# Patient Record
Sex: Female | Born: 1950
Health system: Southern US, Community
[De-identification: ages and names within clinical notes are randomized; demographics above are authoritative.]

## PROBLEM LIST (undated history)

## (undated) DIAGNOSIS — T8859XA Other complications of anesthesia, initial encounter: Secondary | ICD-10-CM

## (undated) DIAGNOSIS — M8580 Other specified disorders of bone density and structure, unspecified site: Secondary | ICD-10-CM

## (undated) DIAGNOSIS — E211 Secondary hyperparathyroidism, not elsewhere classified: Secondary | ICD-10-CM

## (undated) DIAGNOSIS — R112 Nausea with vomiting, unspecified: Secondary | ICD-10-CM

## (undated) DIAGNOSIS — J343 Hypertrophy of nasal turbinates: Secondary | ICD-10-CM

## (undated) DIAGNOSIS — Z9889 Other specified postprocedural states: Secondary | ICD-10-CM

## (undated) DIAGNOSIS — H9192 Unspecified hearing loss, left ear: Secondary | ICD-10-CM

## (undated) DIAGNOSIS — M129 Arthropathy, unspecified: Secondary | ICD-10-CM

## (undated) DIAGNOSIS — E063 Autoimmune thyroiditis: Secondary | ICD-10-CM

## (undated) DIAGNOSIS — E890 Postprocedural hypothyroidism: Secondary | ICD-10-CM

## (undated) DIAGNOSIS — H8102 Meniere's disease, left ear: Secondary | ICD-10-CM

## (undated) DIAGNOSIS — G629 Polyneuropathy, unspecified: Secondary | ICD-10-CM

## (undated) DIAGNOSIS — T4145XA Adverse effect of unspecified anesthetic, initial encounter: Secondary | ICD-10-CM

## (undated) DIAGNOSIS — E559 Vitamin D deficiency, unspecified: Secondary | ICD-10-CM

## (undated) DIAGNOSIS — E0500 Thyrotoxicosis with diffuse goiter without thyrotoxic crisis or storm: Secondary | ICD-10-CM

## (undated) HISTORY — PX: ANTERIOR CRUCIATE LIGAMENT REPAIR: SHX115

## (undated) HISTORY — DX: Autoimmune thyroiditis: E06.3

## (undated) HISTORY — DX: Polyneuropathy, unspecified: G62.9

## (undated) HISTORY — DX: Meniere's disease, left ear: H81.02

## (undated) HISTORY — DX: Other specified disorders of bone density and structure, unspecified site: M85.80

## (undated) HISTORY — DX: Thyrotoxicosis with diffuse goiter without thyrotoxic crisis or storm: E05.00

## (undated) HISTORY — DX: Arthropathy, unspecified: M12.9

## (undated) HISTORY — DX: Secondary hyperparathyroidism, not elsewhere classified: E21.1

## (undated) HISTORY — DX: Postprocedural hypothyroidism: E89.0

## (undated) HISTORY — PX: TONSILLECTOMY AND ADENOIDECTOMY: SHX28

## (undated) HISTORY — DX: Vitamin D deficiency, unspecified: E55.9

## (undated) HISTORY — DX: Hypocalcemia: E83.51

## (undated) HISTORY — DX: Unspecified hearing loss, left ear: H91.92

## (undated) HISTORY — PX: THYROID SURGERY: SHX805

---

## 2000-07-25 ENCOUNTER — Other Ambulatory Visit: Admission: RE | Admit: 2000-07-25 | Discharge: 2000-07-25 | Payer: Self-pay | Admitting: Internal Medicine

## 2004-10-12 ENCOUNTER — Other Ambulatory Visit: Admission: RE | Admit: 2004-10-12 | Discharge: 2004-10-12 | Payer: Self-pay | Admitting: Internal Medicine

## 2005-02-21 ENCOUNTER — Ambulatory Visit: Payer: Self-pay | Admitting: "Endocrinology

## 2005-02-28 ENCOUNTER — Encounter: Admission: RE | Admit: 2005-02-28 | Discharge: 2005-02-28 | Payer: Self-pay | Admitting: "Endocrinology

## 2005-03-28 ENCOUNTER — Ambulatory Visit: Payer: Self-pay | Admitting: "Endocrinology

## 2005-07-04 ENCOUNTER — Ambulatory Visit: Payer: Self-pay | Admitting: "Endocrinology

## 2006-01-31 ENCOUNTER — Ambulatory Visit: Payer: Self-pay | Admitting: "Endocrinology

## 2006-03-05 ENCOUNTER — Encounter: Admission: RE | Admit: 2006-03-05 | Discharge: 2006-03-05 | Payer: Self-pay | Admitting: Internal Medicine

## 2006-03-06 ENCOUNTER — Encounter: Admission: RE | Admit: 2006-03-06 | Discharge: 2006-03-06 | Payer: Self-pay | Admitting: Internal Medicine

## 2006-04-10 ENCOUNTER — Ambulatory Visit: Payer: Self-pay | Admitting: "Endocrinology

## 2006-09-09 ENCOUNTER — Ambulatory Visit: Payer: Self-pay | Admitting: "Endocrinology

## 2007-02-02 ENCOUNTER — Ambulatory Visit (HOSPITAL_COMMUNITY): Admission: RE | Admit: 2007-02-02 | Discharge: 2007-02-03 | Payer: Self-pay | Admitting: Surgery

## 2007-02-02 ENCOUNTER — Encounter (INDEPENDENT_AMBULATORY_CARE_PROVIDER_SITE_OTHER): Payer: Self-pay | Admitting: Specialist

## 2007-03-23 ENCOUNTER — Ambulatory Visit: Payer: Self-pay | Admitting: "Endocrinology

## 2007-07-20 ENCOUNTER — Ambulatory Visit: Payer: Self-pay | Admitting: "Endocrinology

## 2008-01-07 ENCOUNTER — Ambulatory Visit: Payer: Self-pay | Admitting: "Endocrinology

## 2008-08-16 ENCOUNTER — Encounter: Payer: Self-pay | Admitting: "Endocrinology

## 2008-08-16 LAB — CONVERTED CEMR LAB: TSH: 0.432 microintl units/mL (ref 0.350–4.50)

## 2008-08-24 ENCOUNTER — Ambulatory Visit: Payer: Self-pay | Admitting: "Endocrinology

## 2009-05-08 ENCOUNTER — Ambulatory Visit: Payer: Self-pay | Admitting: "Endocrinology

## 2009-10-30 ENCOUNTER — Ambulatory Visit: Payer: Self-pay | Admitting: "Endocrinology

## 2010-04-24 ENCOUNTER — Ambulatory Visit: Payer: Self-pay | Admitting: "Endocrinology

## 2010-10-21 ENCOUNTER — Encounter: Payer: Self-pay | Admitting: "Endocrinology

## 2011-02-15 NOTE — Op Note (Signed)
NAME:  Kristina Rios, Kristina Rios NO.:  [id number]   MEDICAL RECORD NO.:  [id number]          PATIENT TYPE:  AMB   LOCATION:  SDS                          FACILITY:  [facility]   PHYSICIAN:  Velora Heckler, MD      DATE OF BIRTH:  1951/08/14   DATE OF PROCEDURE:  02/02/2007  DATE OF DISCHARGE:                               OPERATIVE REPORT   PREOPERATIVE DIAGNOSIS:  Hyperthyroidism.   POSTOPERATIVE DIAGNOSIS:  Hyperthyroidism.   PROCEDURE:  Total thyroidectomy.   SURGEON:  Velora Heckler, M.D., FACS   ASSISTANT:  Manus Rudd, M.D., FACS   ANESTHESIA:  General per Dr. Hart Robinsons   ESTIMATED BLOOD LOSS:  Minimal.   PREPARATION:  Betadine.   COMPLICATIONS:  None.   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  She has been followed by Dr. Molli Knock for  hyperthyroidism.  The patient was initially placed on Methimazole but  tolerated this poorly, developing a migrating polyarthritis syndrome.  The patient is on beta-blockade.  She now comes to surgery for  thyroidectomy for management of hyperthyroidism.  The patient refuses  radioactive iodine treatment.   The procedure is done in OR #16 at the [facility].  The patient is brought to the operating room and placed in a supine  position on the operating room table.  Following the administration of  general anesthesia, the patient is positioned and then prepped and  draped in the usual strict aseptic fashion.  After ascertaining that an  adequate level of anesthesia had been obtained, a Kocher incision is  made with a #15 blade.  Dissection is carried through subcutaneous  tissues and platysma.  Hemostasis is obtained with the electrocautery.  Skin flaps are elevated cephalad and caudad from the thyroid notch to  the sternal notch.  A Mahorner self-retaining retractor is placed for  exposure.  Strap muscles are incised in the midline, and  dissection is  begun on the left side of the neck.  Strap muscles are reflected  laterally.  Left thyroid lobe is exposed.  Middle vein is divided  between small Ligaclips with the harmonic scalpel.  Inferior venous  tributaries are divided with the harmonic scalpel between small  Ligaclips.  Gland is rolled anteriorly.  Superior pole vessels are  dissected out, ligated in continuity with 2-0 silk ties, and divided  with the harmonic scalpel.  Gland is rolled further anteriorly.  Parathyroid tissue is identified and preserved.  Branches of the  inferior thyroid artery are divided with the harmonic scalpel.  Recurrent nerve was identified and preserved.  Ligament of Allyson Sabal is  transected, with the electrocautery, and the gland is mobilized up and  onto the anterior trachea.  Good hemostasis is noted in the left neck.  A dry pack is placed in the left neck.   Next, we turned our attention to the right thyroid lobe.  Again, strap  muscles are reflected laterally exposing the lobe.  Right thyroid lobe  is larger in size and more nodular than the left.  Again, middle  vein is  divided between Ligaclips with the harmonic scalpel.  Inferior venous  tributaries are divided between Ligaclips with the harmonic scalpel.  Superior pole is dissected out.  Superior pole vessels are divided  between Ligaclips with the harmonic scalpel.  Gland is rolled  anteriorly.  Parathyroid tissue is identified and preserved.  Branches  of the inferior thyroid artery are divided between small Ligaclips with  the harmonic scalpel.  Recurrent nerve is identified and preserved.  Ligament of Allyson Sabal is transected with the electrocautery, and the gland  is rolled anteriorly and excised completely off of the anterior trachea.  A suture is used to mark the right superior pole.  Entire gland is then  submitted to pathology for review.  Neck is irrigated, and Surgicel is  placed over the area of the parathyroids and recurrent  nerves  bilaterally.  Good hemostasis is noted.  Strap muscles are  reapproximated in the midline with interrupted 3-0 Vicryl sutures.  Platysma is closed with interrupted 3-0 Vicryl sutures.  Skin is closed  with running 4-0 Vicryl subcuticular suture.  Wound is washed and dried,  and Benzoin and Steri-Strips are applied.  Sterile dressings are  applied.  The patient is awakened from anesthesia and brought to the  recovery room in stable condition.  The patient tolerated the procedure  well.      Velora Heckler, MD  Electronically Signed     TMG/MEDQ  D:  02/02/2007  T:  02/02/2007  Job:  [id number]   cc:   David Stall, M.D.  Estill Batten, M.D.

## 2011-02-21 ENCOUNTER — Encounter: Payer: Self-pay | Admitting: *Deleted

## 2011-02-21 DIAGNOSIS — E0590 Thyrotoxicosis, unspecified without thyrotoxic crisis or storm: Secondary | ICD-10-CM | POA: Insufficient documentation

## 2011-02-21 DIAGNOSIS — E0500 Thyrotoxicosis with diffuse goiter without thyrotoxic crisis or storm: Secondary | ICD-10-CM | POA: Insufficient documentation

## 2011-02-25 ENCOUNTER — Other Ambulatory Visit: Payer: Self-pay | Admitting: "Endocrinology

## 2011-02-25 DIAGNOSIS — E038 Other specified hypothyroidism: Secondary | ICD-10-CM

## 2011-04-01 ENCOUNTER — Other Ambulatory Visit: Payer: Self-pay | Admitting: "Endocrinology

## 2011-04-01 DIAGNOSIS — E038 Other specified hypothyroidism: Secondary | ICD-10-CM

## 2011-04-04 ENCOUNTER — Other Ambulatory Visit: Payer: Self-pay | Admitting: *Deleted

## 2011-04-04 DIAGNOSIS — E038 Other specified hypothyroidism: Secondary | ICD-10-CM

## 2011-04-25 ENCOUNTER — Other Ambulatory Visit: Payer: Self-pay | Admitting: "Endocrinology

## 2011-04-25 LAB — COMPREHENSIVE METABOLIC PANEL
ALT: 18 U/L (ref 0–35)
AST: 24 U/L (ref 0–37)
CO2: 27 mEq/L (ref 19–32)
Calcium: 9.4 mg/dL (ref 8.4–10.5)
Chloride: 100 mEq/L (ref 96–112)
Creat: 0.81 mg/dL (ref 0.50–1.10)
Sodium: 138 mEq/L (ref 135–145)
Total Bilirubin: 0.5 mg/dL (ref 0.3–1.2)
Total Protein: 6.7 g/dL (ref 6.0–8.3)

## 2011-04-26 LAB — CLIENT PROFILE 3332
Free T4: 1.39 ng/dL (ref 0.80–1.80)
T3, Free: 3.2 pg/mL (ref 2.3–4.2)

## 2011-04-27 LAB — VITAMIN D 1,25 DIHYDROXY
Vitamin D2 1, 25 (OH)2: <8 pg/mL
Vitamin D3 1, 25 (OH)2: 50 pg/mL

## 2011-05-15 ENCOUNTER — Telehealth: Payer: Self-pay | Admitting: *Deleted

## 2011-05-15 NOTE — Telephone Encounter (Signed)
Per Natalia Leatherwood: 1.  PCP Allena Napoleon, MD received a copy of 04/25/11 lab results and discussed the abnormal PTH result:    80.2;  High of normal range is 72.0.   2.  Calcium, Total (PTH)  9.4 at the upper range of normal. 3.  CMP normal  4.  Vitamin D 1-25 Dihydroxy  50, normal. 5.  Vitamin D 25 Hydroxy  82, upper end of normal 6.  Still taking Calcitonin Nasal Spray.  Is it possible this is interfering with the PTH (Intact)? 7.  F/U appt. 05/30/11.  I will ask Dr. Fransico Michael to call her today.

## 2011-05-30 ENCOUNTER — Ambulatory Visit (INDEPENDENT_AMBULATORY_CARE_PROVIDER_SITE_OTHER): Payer: PRIVATE HEALTH INSURANCE | Admitting: "Endocrinology

## 2011-05-30 ENCOUNTER — Encounter: Payer: Self-pay | Admitting: "Endocrinology

## 2011-05-30 VITALS — BP 126/82 | HR 70 | Ht 67.64 in | Wt 137.8 lb

## 2011-05-30 DIAGNOSIS — E890 Postprocedural hypothyroidism: Secondary | ICD-10-CM | POA: Insufficient documentation

## 2011-05-30 DIAGNOSIS — H8102 Meniere's disease, left ear: Secondary | ICD-10-CM | POA: Insufficient documentation

## 2011-05-30 DIAGNOSIS — G609 Hereditary and idiopathic neuropathy, unspecified: Secondary | ICD-10-CM

## 2011-05-30 DIAGNOSIS — N2581 Secondary hyperparathyroidism of renal origin: Secondary | ICD-10-CM | POA: Insufficient documentation

## 2011-05-30 DIAGNOSIS — R259 Unspecified abnormal involuntary movements: Secondary | ICD-10-CM

## 2011-05-30 DIAGNOSIS — M949 Disorder of cartilage, unspecified: Secondary | ICD-10-CM

## 2011-05-30 DIAGNOSIS — M129 Arthropathy, unspecified: Secondary | ICD-10-CM | POA: Insufficient documentation

## 2011-05-30 DIAGNOSIS — R251 Tremor, unspecified: Secondary | ICD-10-CM

## 2011-05-30 DIAGNOSIS — M8580 Other specified disorders of bone density and structure, unspecified site: Secondary | ICD-10-CM | POA: Insufficient documentation

## 2011-05-30 DIAGNOSIS — E0500 Thyrotoxicosis with diffuse goiter without thyrotoxic crisis or storm: Secondary | ICD-10-CM | POA: Insufficient documentation

## 2011-05-30 DIAGNOSIS — E063 Autoimmune thyroiditis: Secondary | ICD-10-CM | POA: Insufficient documentation

## 2011-05-30 DIAGNOSIS — E559 Vitamin D deficiency, unspecified: Secondary | ICD-10-CM

## 2011-05-30 DIAGNOSIS — G629 Polyneuropathy, unspecified: Secondary | ICD-10-CM | POA: Insufficient documentation

## 2011-05-30 DIAGNOSIS — H9192 Unspecified hearing loss, left ear: Secondary | ICD-10-CM | POA: Insufficient documentation

## 2011-05-30 DIAGNOSIS — E211 Secondary hyperparathyroidism, not elsewhere classified: Secondary | ICD-10-CM
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
## 2011-05-30 NOTE — Patient Instructions (Signed)
Followup visit in 6 months. Please have lab tests for calcium, PTH, and vitamin D levels drawn in about mid-November.

## 2011-06-30 ENCOUNTER — Other Ambulatory Visit: Payer: Self-pay | Admitting: "Endocrinology

## 2011-08-15 ENCOUNTER — Other Ambulatory Visit: Payer: Self-pay | Admitting: "Endocrinology

## 2011-08-16 LAB — PTH, INTACT AND CALCIUM
Calcium, Total (PTH): 9.2 mg/dL (ref 8.4–10.5)
PTH: 32.1 pg/mL (ref 14.0–72.0)

## 2011-08-18 LAB — VITAMIN D 1,25 DIHYDROXY: Vitamin D2 1, 25 (OH)2: <8 pg/mL

## 2011-09-21 ENCOUNTER — Telehealth: Payer: Self-pay | Admitting: "Endocrinology

## 2011-09-27 ENCOUNTER — Other Ambulatory Visit: Payer: Self-pay | Admitting: "Endocrinology

## 2011-10-18 ENCOUNTER — Other Ambulatory Visit: Payer: Self-pay | Admitting: *Deleted

## 2011-10-18 DIAGNOSIS — E890 Postprocedural hypothyroidism: Secondary | ICD-10-CM

## 2011-10-18 MED ORDER — SYNTHROID 112 MCG PO TABS: 112.0000 ug | ORAL_TABLET | Freq: Every day | ORAL | Status: DC

## 2011-11-06 ENCOUNTER — Encounter: Payer: Self-pay | Admitting: "Endocrinology

## 2011-11-06 DIAGNOSIS — E0500 Thyrotoxicosis with diffuse goiter without thyrotoxic crisis or storm: Secondary | ICD-10-CM | POA: Insufficient documentation

## 2011-11-20 NOTE — Telephone Encounter (Signed)
Pt taking 80 mg of Ca and 4,000 IU of Vit D. Re NOV labs: PTH is Nl. Calcium is low-Nl. Both Vitamin D's are elevated. Please increase Ca to 1000 mg/day. Please decrease Vit D to 3,000 IU/day.  Kristina Rios

## 2011-12-26 ENCOUNTER — Ambulatory Visit: Payer: PRIVATE HEALTH INSURANCE | Admitting: "Endocrinology

## 2011-12-30 ENCOUNTER — Other Ambulatory Visit: Payer: Self-pay | Admitting: "Endocrinology

## 2012-03-05 ENCOUNTER — Encounter: Payer: Self-pay | Admitting: "Endocrinology

## 2012-03-05 ENCOUNTER — Ambulatory Visit (INDEPENDENT_AMBULATORY_CARE_PROVIDER_SITE_OTHER): Payer: PRIVATE HEALTH INSURANCE | Admitting: "Endocrinology

## 2012-03-05 VITALS — BP 128/72 | HR 79 | Wt 132.4 lb

## 2012-03-05 DIAGNOSIS — M949 Disorder of cartilage, unspecified: Secondary | ICD-10-CM

## 2012-03-05 DIAGNOSIS — E559 Vitamin D deficiency, unspecified: Secondary | ICD-10-CM

## 2012-03-05 DIAGNOSIS — E890 Postprocedural hypothyroidism: Secondary | ICD-10-CM

## 2012-03-05 DIAGNOSIS — M8580 Other specified disorders of bone density and structure, unspecified site: Secondary | ICD-10-CM

## 2012-03-05 DIAGNOSIS — E211 Secondary hyperparathyroidism, not elsewhere classified: Secondary | ICD-10-CM

## 2012-03-05 DIAGNOSIS — A449 Bartonellosis, unspecified: Secondary | ICD-10-CM
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
## 2012-03-05 NOTE — Patient Instructions (Signed)
Follow-up visit in six months. Please have lab tests drawn in 3 and 6 months. Please change Synthroid dosage to 112 mcg four days per week and 125 mcg three days per week.

## 2012-03-17 ENCOUNTER — Encounter: Payer: Self-pay | Admitting: Internal Medicine

## 2012-03-17 ENCOUNTER — Ambulatory Visit (INDEPENDENT_AMBULATORY_CARE_PROVIDER_SITE_OTHER): Payer: PRIVATE HEALTH INSURANCE | Admitting: Internal Medicine

## 2012-03-17 VITALS — BP 96/65 | HR 67 | Temp 98.9°F | Ht 67.5 in | Wt 132.5 lb

## 2012-03-17 DIAGNOSIS — J302 Other seasonal allergic rhinitis: Secondary | ICD-10-CM

## 2012-03-17 DIAGNOSIS — J309 Allergic rhinitis, unspecified: Secondary | ICD-10-CM

## 2012-03-18 DIAGNOSIS — J302 Other seasonal allergic rhinitis: Secondary | ICD-10-CM | POA: Insufficient documentation

## 2012-03-18 NOTE — Progress Notes (Signed)
Patient ID: Kristina Rios, female   DOB: October 15, 1950, 61 y.o.   MRN: [id number]    [facility] for Infectious Disease  Reason for Consult: Possible chronic Bartonella infection Referring Physician: Dr. Molli Knock  Patient Active Problem List  Diagnosis  . Hyperthyroidism  . Toxic diffuse goiter  . Hypothyroidism, postop  . Thyrotoxicosis with diffuse goiter  . Vitamin D deficiency disease  . Hyperparathyroidism due to vitamin D deficiency  . Osteopenia  . Meniere's disease of left ear  . Hearing loss on left  . Neuropathy, peripheral  . Arthropathy  . Thyroiditis, autoimmune  . Hypothyroidism, acquired, autoimmune  . Hypocalcemia  . Grave's disease  . Seasonal allergies    Patient's Medications  New Prescriptions   No medications on file  Previous Medications   ALPHA LIPOIC ACID PO    Take by mouth.   AZELASTINE (ASTELIN) 137 MCG/SPRAY NASAL SPRAY    Place 1 spray into the nose 2 (two) times daily. Use in each nostril as directed   CALCITONIN, SALMON, (MIACALCIN/FORTICAL) 200 UNIT/ACT NASAL SPRAY    Place 1 spray into the nose daily.     CALCIUM CARBONATE (OS-CAL) 1250 MG CHEWABLE TABLET    Chew 1 tablet by mouth daily.   CHOLECALCIFEROL (VITAMIN D3) 2000 UNITS TABS    Take by mouth 2 (two) times daily.     POTASSIUM IODIDE PO    Take 25 mg by mouth 3 (three) times daily. Brand Name Iodoral, Iodine and Potassium Iiodide Supplementation 25 mg TID    SYNTHROID 112 MCG TABLET    Take 1 tablet (112 mcg total) by mouth daily.  Modified Medications   Modified Medication Previous Medication   LEVOTHYROXINE (SYNTHROID) 125 MCG TABLET SYNTHROID 125 MCG tablet          TAKE 1 TABLET 2 TIMES PER WEEK  Discontinued Medications   No medications on file    Recommendations: 1. Observe off of antibiotics   Assessment: Although she is probably at significant risk for exposure to Bartonella do to her occupation, the data available to me from Dr. Christoper Allegra are  insufficient to make a conclusive diagnosis of chronic Bartonella infection. He has published about his methods for detection of Bartonella and has reported very high prevalence rate is in selected populations of rheumatology patients and veterinarians. He uses a Engineer, civil (consulting) (he is an Engineer, civil (consulting) in [employer]) described as preenrichment culture ("BAPGM") followed by PCR amplification. To my knowledge his lab's findings have not been replicated by other groups yet. At this point there is not enough evidence to know what to make of his letter. In the worse case scenario, even if she does have chronic Bartonella infection, I cannot be sure that it is causing any of her symptoms. Also there are no evidence-based guidelines for antibiotic therapy and an unusual situation like this. Since she has not gotten any benefit from a multiple long courses of empiric antibiotic therapy would not try it again. I suggested that she contact his office to see if he is working with any medical physicians on starting a prospective clinical trial to further investigate this hypothesis.  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 Molli Knock for evaluation of possible Bartonella infection. She describes not feeling completely well for the past 10 years. She dates the onset of her illness to a sinus infection in October of 2003. Around that time she noticed some decreased  hearing in her left ear and tinnitus and was seen by Dr. Allegra Grana and later by Dr. Ermalinda Barrios, two local ENT physicians, in 2004. She was diagnosed with Mnire's disease. In 2005 her tinnitus resolved but she was left with chronic sinus congestion.  In 2006 she began to have problems with autoimmune thyroiditis. In 2007 she was briefly on methimazole but developed a migratory joint syndrome that she states was felt to be due to the methimazole. She saw Dr. Vicki Mallet, a local  rheumatologist, at that time and her acute joint symptoms resolved off of methimazole. However she has been left with intermittent joint pains since that time. She underwent thyroidectomy in 2008. Around this time she also began to suffer from problems with reflux symptoms. In 2010 she had increased sinus pain. She saw another local ENT physician, Dr. Newman Pies. She reports that an MRI of her sinuses and brain showed only polypoid changes in the sinuses. She was started on regular saline nasal flushes and a steroid spray. She states that after she uses the saline flushes he gets transient joint pain and feels like she may be "flushing bad things out of my head".  She states that she also suffers from chronic neuropathy which is most notable in her right arm and feet. She has intermittent tingling and some numbness. She also reports "brain fog"and problems with memory and concentration over the last several years.  Last year she had blood drawn and submitted to the lab of Dr. Isac Caddy, a veterinarian at [facility] who has an interest in Bartonella infections. In July of last year she received a letter from his office stating that her blood was positive for Bartonella henselae bacteria. Because of concerns that her symptoms might be related to chronic Bartonella infection, her primary care physician, Dr. Mia Creek, has treated her with multiple rounds of antibiotics. She recalls being treated with levofloxacin for 30 days last fall. She is also received about 3 months of therapy with doxycycline with some at that time also been on azithromycin. She also was on rifampin for about one week but states that this was stopped after it was noted that she developed eosinophilia. She did not note any change in her chronic symptoms while on antibiotics. She states that she is also now taking some herbal formulas that had been used to treat patients with Lyme disease but  also does not notice any change in her symptoms.  She has continued working and playing competitive golf despite her symptoms. She has not had any fever, chills, sweats, adenopathy or change in weight. Her most recent blood work did not show any significant abnormalities of her CBC or complete metabolic panel.  Review of Systems: Pertinent items are noted in HPI.      Past Medical History  Diagnosis Date  . Hypothyroidism, postop   . Thyrotoxicosis with diffuse goiter   . Vitamin D deficiency disease   . Hyperparathyroidism due to vitamin D deficiency   . Osteopenia   . Meniere's disease of left ear   . Hearing loss on left   . Neuropathy, peripheral   . Arthropathy   . Hypothyroidism, acquired, autoimmune   . Osteopenia   . Hypocalcemia   . Grave's disease   . Neuropathy, peripheral     History  Substance Use Topics  . Smoking status: Never Smoker   . Smokeless tobacco: Never Used  . Alcohol Use: 1.0 - 1.5 oz/week  2-3 drink(s) per week    Family History  Problem Relation Age of Onset  . Thyroid disease Mother   . Cancer Brother   . Hypertension Brother   . Obesity Brother   . Diabetes Neg Hx   . Anemia Neg Hx   . Kidney disease Neg Hx   . Heart disease Father   . Heart disease Paternal Grandfather    Allergies  Allergen Reactions  . Amoxicillin-Pot Clavulanate   . Methimazole     OBJECTIVE: Blood pressure 96/65, pulse 67, temperature 98.9 F (37.2 C), temperature source Oral, height 5' 7.5" (1.715 m), weight 132 lb 8 oz (60.102 kg). General: She is a healthy-appearing female in no distress. Skin: No rash, splinter or conjunctival hemorrhages Eyes: Normal external exam Oral: Teeth in good condition, no oral pharyngeal lesions Lymph nodes: No palpable adenopathy Lungs: Clear Cor: Regular S1 and S2 with no murmurs Abdomen: Soft and nontender. I do not feel a liver, spleen or other masses Joints and extremities: Normal Neuro: She is an alert and  oriented x3 with normal speech. She has no focal neurologic deficits.  Cliffton Asters, MD [facility] for Infectious Disease [facility] [phone] pager   [phone] cell 03/18/2012, 11:42 AM

## 2012-04-24 ENCOUNTER — Other Ambulatory Visit: Payer: Self-pay | Admitting: *Deleted

## 2012-04-24 DIAGNOSIS — E039 Hypothyroidism, unspecified: Secondary | ICD-10-CM

## 2012-05-16 ENCOUNTER — Other Ambulatory Visit: Payer: Self-pay | Admitting: "Endocrinology

## 2012-07-17 ENCOUNTER — Other Ambulatory Visit: Payer: Self-pay | Admitting: "Endocrinology

## 2012-08-10 ENCOUNTER — Other Ambulatory Visit: Payer: Self-pay | Admitting: *Deleted

## 2012-08-10 ENCOUNTER — Other Ambulatory Visit: Payer: Self-pay | Admitting: "Endocrinology

## 2012-08-10 DIAGNOSIS — E038 Other specified hypothyroidism: Secondary | ICD-10-CM

## 2012-09-07 ENCOUNTER — Other Ambulatory Visit: Payer: Self-pay | Admitting: *Deleted

## 2012-09-07 DIAGNOSIS — M810 Age-related osteoporosis without current pathological fracture: Secondary | ICD-10-CM

## 2012-09-08 ENCOUNTER — Ambulatory Visit: Payer: PRIVATE HEALTH INSURANCE | Admitting: "Endocrinology

## 2012-09-17 ENCOUNTER — Other Ambulatory Visit: Payer: PRIVATE HEALTH INSURANCE

## 2012-09-22 LAB — PTH, INTACT AND CALCIUM
Calcium, Total (PTH): 9.2 mg/dL (ref 8.4–10.5)
PTH: 53 pg/mL (ref 14.0–72.0)

## 2012-09-22 LAB — VITAMIN D 25 HYDROXY (VIT D DEFICIENCY, FRACTURES): Vit D, 25-Hydroxy: 80 ng/mL (ref 30–89)

## 2012-09-22 LAB — T3, FREE: T3, Free: 2.5 pg/mL (ref 2.3–4.2)

## 2012-09-22 LAB — T4, FREE: Free T4: 0.9 ng/dL (ref 0.80–1.80)

## 2012-09-22 LAB — TSH: TSH: 5.308 u[IU]/mL — ABNORMAL HIGH (ref 0.350–4.500)

## 2012-09-25 LAB — VITAMIN D 1,25 DIHYDROXY: Vitamin D2 1, 25 (OH)2: <8 pg/mL

## 2012-09-28 ENCOUNTER — Other Ambulatory Visit (INDEPENDENT_AMBULATORY_CARE_PROVIDER_SITE_OTHER): Payer: Self-pay | Admitting: Otolaryngology

## 2012-09-28 DIAGNOSIS — J329 Chronic sinusitis, unspecified: Secondary | ICD-10-CM

## 2012-09-29 ENCOUNTER — Ambulatory Visit
Admission: RE | Admit: 2012-09-29 | Discharge: 2012-09-29 | Disposition: A | Discharge status: Home / Self Care | Payer: PRIVATE HEALTH INSURANCE | Source: Ambulatory Visit | Attending: Otolaryngology | Admitting: Otolaryngology

## 2012-09-29 ENCOUNTER — Ambulatory Visit (INDEPENDENT_AMBULATORY_CARE_PROVIDER_SITE_OTHER): Payer: PRIVATE HEALTH INSURANCE | Admitting: "Endocrinology

## 2012-09-29 VITALS — BP 116/80 | HR 66 | Wt 131.2 lb

## 2012-09-29 DIAGNOSIS — E559 Vitamin D deficiency, unspecified: Secondary | ICD-10-CM

## 2012-09-29 DIAGNOSIS — E890 Postprocedural hypothyroidism: Secondary | ICD-10-CM

## 2012-09-29 DIAGNOSIS — N2581 Secondary hyperparathyroidism of renal origin: Secondary | ICD-10-CM

## 2012-09-29 DIAGNOSIS — M8580 Other specified disorders of bone density and structure, unspecified site: Secondary | ICD-10-CM

## 2012-09-29 DIAGNOSIS — J329 Chronic sinusitis, unspecified: Secondary | ICD-10-CM

## 2012-09-29 DIAGNOSIS — M949 Disorder of cartilage, unspecified: Secondary | ICD-10-CM

## 2012-09-29 NOTE — Patient Instructions (Addendum)
Follow up visit in 6 months. Please repeat TFTs in two months.
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
## 2012-09-30 ENCOUNTER — Encounter: Payer: Self-pay | Admitting: "Endocrinology

## 2012-12-20 ENCOUNTER — Other Ambulatory Visit: Payer: Self-pay | Admitting: "Endocrinology

## 2012-12-20 DIAGNOSIS — E038 Other specified hypothyroidism: Secondary | ICD-10-CM

## 2013-02-10 ENCOUNTER — Other Ambulatory Visit: Payer: Self-pay | Admitting: *Deleted

## 2013-02-10 DIAGNOSIS — E038 Other specified hypothyroidism: Secondary | ICD-10-CM

## 2013-02-10 DIAGNOSIS — M810 Age-related osteoporosis without current pathological fracture: Secondary | ICD-10-CM

## 2013-03-13 ENCOUNTER — Other Ambulatory Visit: Payer: Self-pay | Admitting: "Endocrinology

## 2013-03-29 ENCOUNTER — Ambulatory Visit: Payer: PRIVATE HEALTH INSURANCE | Admitting: "Endocrinology

## 2013-04-30 ENCOUNTER — Other Ambulatory Visit: Payer: Self-pay | Admitting: *Deleted

## 2013-04-30 DIAGNOSIS — E890 Postprocedural hypothyroidism: Secondary | ICD-10-CM

## 2013-05-01 LAB — MICROALBUMIN / CREATININE URINE RATIO
Creatinine, Urine: 70.4 mg/dL
Microalb Creat Ratio: 15.3 mg/g (ref 0.0–30.0)
Microalb, Ur: 1.08 mg/dL (ref 0.00–1.89)

## 2013-05-01 LAB — COMPREHENSIVE METABOLIC PANEL
ALT: 17 U/L (ref 0–35)
Albumin: 4.7 g/dL (ref 3.5–5.2)
CO2: 29 mEq/L (ref 19–32)
Calcium: 9.6 mg/dL (ref 8.4–10.5)
Chloride: 101 mEq/L (ref 96–112)
Creat: 0.82 mg/dL (ref 0.50–1.10)
Potassium: 4.4 mEq/L (ref 3.5–5.3)
Sodium: 138 mEq/L (ref 135–145)
Total Protein: 6.7 g/dL (ref 6.0–8.3)

## 2013-05-01 LAB — LIPID PANEL: Cholesterol: 211 mg/dL — ABNORMAL HIGH (ref 0–200)

## 2013-05-01 LAB — T3, FREE: T3, Free: 3 pg/mL (ref 2.3–4.2)

## 2013-05-18 ENCOUNTER — Ambulatory Visit (INDEPENDENT_AMBULATORY_CARE_PROVIDER_SITE_OTHER): Payer: PRIVATE HEALTH INSURANCE | Admitting: "Endocrinology

## 2013-05-18 ENCOUNTER — Encounter: Payer: Self-pay | Admitting: "Endocrinology

## 2013-05-18 VITALS — BP 99/67 | HR 74 | Wt 127.3 lb

## 2013-05-18 DIAGNOSIS — E211 Secondary hyperparathyroidism, not elsewhere classified: Secondary | ICD-10-CM

## 2013-05-18 DIAGNOSIS — M8580 Other specified disorders of bone density and structure, unspecified site: Secondary | ICD-10-CM

## 2013-05-18 DIAGNOSIS — E890 Postprocedural hypothyroidism: Secondary | ICD-10-CM

## 2013-05-18 DIAGNOSIS — E559 Vitamin D deficiency, unspecified: Secondary | ICD-10-CM

## 2013-05-18 DIAGNOSIS — M899 Disorder of bone, unspecified: Secondary | ICD-10-CM

## 2013-05-18 NOTE — Patient Instructions (Signed)
Follow up visit in 6 months. 
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
## 2013-07-26 LAB — T3, FREE: T3, Free: 3 pg/mL (ref 2.3–4.2)

## 2013-08-18 ENCOUNTER — Other Ambulatory Visit: Payer: Self-pay | Admitting: *Deleted

## 2013-08-18 DIAGNOSIS — E038 Other specified hypothyroidism: Secondary | ICD-10-CM

## 2013-08-18 DIAGNOSIS — E890 Postprocedural hypothyroidism: Secondary | ICD-10-CM

## 2013-08-18 MED ORDER — SYNTHROID 125 MCG PO TABS: ORAL_TABLET | ORAL | Status: DC

## 2013-08-18 MED ORDER — SYNTHROID 112 MCG PO TABS: ORAL_TABLET | ORAL | Status: DC

## 2013-08-31 ENCOUNTER — Other Ambulatory Visit: Payer: Self-pay | Admitting: *Deleted

## 2013-08-31 DIAGNOSIS — E890 Postprocedural hypothyroidism: Secondary | ICD-10-CM

## 2013-08-31 DIAGNOSIS — E038 Other specified hypothyroidism: Secondary | ICD-10-CM

## 2013-08-31 MED ORDER — SYNTHROID 112 MCG PO TABS: ORAL_TABLET | ORAL | Status: DC

## 2013-08-31 MED ORDER — SYNTHROID 125 MCG PO TABS: ORAL_TABLET | ORAL | Status: DC

## 2013-09-06 ENCOUNTER — Other Ambulatory Visit: Payer: Self-pay | Admitting: *Deleted

## 2013-09-06 DIAGNOSIS — E038 Other specified hypothyroidism: Secondary | ICD-10-CM

## 2013-09-06 DIAGNOSIS — E890 Postprocedural hypothyroidism: Secondary | ICD-10-CM

## 2013-09-06 MED ORDER — SYNTHROID 125 MCG PO TABS: ORAL_TABLET | ORAL | Status: DC

## 2013-09-06 MED ORDER — SYNTHROID 112 MCG PO TABS: ORAL_TABLET | ORAL | Status: DC

## 2013-10-13 ENCOUNTER — Other Ambulatory Visit: Payer: Self-pay | Admitting: *Deleted

## 2013-10-13 DIAGNOSIS — E890 Postprocedural hypothyroidism: Secondary | ICD-10-CM

## 2013-11-15 LAB — TSH: TSH: 2.345 u[IU]/mL (ref 0.350–4.500)

## 2013-11-15 LAB — T3, FREE: T3 FREE: 2.8 pg/mL (ref 2.3–4.2)

## 2013-11-15 LAB — T4, FREE: FREE T4: 1.51 ng/dL (ref 0.80–1.80)

## 2013-11-16 LAB — VITAMIN D 25 HYDROXY (VIT D DEFICIENCY, FRACTURES): Vit D, 25-Hydroxy: 94 ng/mL — ABNORMAL HIGH (ref 30–89)

## 2013-11-17 LAB — PTH, INTACT AND CALCIUM
Calcium: 9.5 mg/dL (ref 8.4–10.5)
PTH: 52.4 pg/mL (ref 14.0–72.0)

## 2013-11-18 LAB — VITAMIN D 1,25 DIHYDROXY
Vitamin D 1, 25 (OH)2 Total: 92 pg/mL — ABNORMAL HIGH (ref 18–72)
Vitamin D2 1, 25 (OH)2: <8 pg/mL
Vitamin D3 1, 25 (OH)2: 92 pg/mL

## 2013-11-22 ENCOUNTER — Encounter: Payer: Self-pay | Admitting: "Endocrinology

## 2013-11-22 ENCOUNTER — Ambulatory Visit (INDEPENDENT_AMBULATORY_CARE_PROVIDER_SITE_OTHER): Payer: 59 | Admitting: "Endocrinology

## 2013-11-22 VITALS — BP 116/79 | HR 78 | Wt 124.5 lb

## 2013-11-22 DIAGNOSIS — M899 Disorder of bone, unspecified: Secondary | ICD-10-CM

## 2013-11-22 DIAGNOSIS — E559 Vitamin D deficiency, unspecified: Secondary | ICD-10-CM

## 2013-11-22 DIAGNOSIS — E038 Other specified hypothyroidism: Secondary | ICD-10-CM

## 2013-11-22 DIAGNOSIS — M8580 Other specified disorders of bone density and structure, unspecified site: Secondary | ICD-10-CM

## 2013-11-22 DIAGNOSIS — E890 Postprocedural hypothyroidism: Secondary | ICD-10-CM

## 2013-11-22 DIAGNOSIS — E211 Secondary hyperparathyroidism, not elsewhere classified: Secondary | ICD-10-CM

## 2013-11-22 DIAGNOSIS — M949 Disorder of cartilage, unspecified: Secondary | ICD-10-CM
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
## 2013-11-22 NOTE — Patient Instructions (Addendum)
Follow up visit in 6 months. Please reduce vitamin d intake by about 200-400 IU/day. Change Synthroid to 125 mcg/day for 5 days per week and 112 mcg/day for two days per week. Increase calcium to about 1200 mg/day. Please repeat TFTs in late April.

## 2014-02-10 LAB — T3, FREE: T3, Free: 3.3 pg/mL (ref 2.3–4.2)

## 2014-02-10 LAB — TSH: TSH: 0.368 u[IU]/mL (ref 0.350–4.500)

## 2014-02-10 LAB — T4, FREE: Free T4: 1.68 ng/dL (ref 0.80–1.80)

## 2014-02-23 ENCOUNTER — Encounter: Payer: Self-pay | Admitting: *Deleted

## 2014-03-21 ENCOUNTER — Other Ambulatory Visit: Payer: Self-pay | Admitting: "Endocrinology

## 2014-04-22 ENCOUNTER — Telehealth: Payer: Self-pay | Admitting: "Endocrinology

## 2014-04-22 DIAGNOSIS — E039 Hypothyroidism, unspecified: Secondary | ICD-10-CM

## 2014-04-22 MED ORDER — SYNTHROID 125 MCG PO TABS: ORAL_TABLET | ORAL | Status: DC

## 2014-04-22 MED ORDER — LEVOTHYROXINE SODIUM 112 MCG PO TABS: ORAL_TABLET | ORAL | Status: DC

## 2014-04-22 NOTE — Telephone Encounter (Signed)
1. Patient called to request that I send in a new prescription for Synthroid brand, 125 mcg tablets, on etablet 5 days of each week, number 65, 3 refills and synthroid 112 mcg/day twice weekly, number 26, 3 refills. I agreed. 2.I sent in the e-scrip and to avoid confusion also called the [facility] in [place] and gave them the two prescriptions verbally.  David StallBRENNAN,Bret Vanessen J

## 2014-04-25 ENCOUNTER — Other Ambulatory Visit: Payer: Self-pay | Admitting: *Deleted

## 2014-04-25 ENCOUNTER — Telehealth: Payer: Self-pay | Admitting: "Endocrinology

## 2014-04-25 DIAGNOSIS — E039 Hypothyroidism, unspecified: Secondary | ICD-10-CM

## 2014-04-25 MED ORDER — SYNTHROID 125 MCG PO TABS: ORAL_TABLET | ORAL | Status: DC

## 2014-04-25 NOTE — Telephone Encounter (Signed)
TC to patient to advice will re- send rx. Provider called last Friday and also sent via e-script. Patient to call pharmacy. Joylene GrapesLIbarra.

## 2014-07-28 ENCOUNTER — Other Ambulatory Visit: Payer: Self-pay | Admitting: *Deleted

## 2014-07-28 DIAGNOSIS — E890 Postprocedural hypothyroidism: Secondary | ICD-10-CM

## 2014-08-04 LAB — CALCIUM: Calcium: 9.5 mg/dL (ref 8.4–10.5)

## 2014-08-04 LAB — T3, FREE: T3, Free: 2.8 pg/mL (ref 2.3–4.2)

## 2014-08-04 LAB — PTH, INTACT AND CALCIUM
CALCIUM: 9.5 mg/dL (ref 8.4–10.5)
PTH: 37 pg/mL (ref 14–64)

## 2014-08-04 LAB — VITAMIN D 25 HYDROXY (VIT D DEFICIENCY, FRACTURES): Vit D, 25-Hydroxy: 86 ng/mL (ref 30–89)

## 2014-08-04 LAB — TSH: TSH: 1.595 u[IU]/mL (ref 0.350–4.500)

## 2014-08-04 LAB — T4, FREE: Free T4: 1.48 ng/dL (ref 0.80–1.80)

## 2014-08-06 LAB — VITAMIN D 1,25 DIHYDROXY
VITAMIN D3 1, 25 (OH): 50 pg/mL
Vitamin D 1, 25 (OH)2 Total: 50 pg/mL (ref 18–72)

## 2014-08-11 ENCOUNTER — Ambulatory Visit: Payer: 59 | Admitting: "Endocrinology

## 2014-08-16 ENCOUNTER — Ambulatory Visit (INDEPENDENT_AMBULATORY_CARE_PROVIDER_SITE_OTHER): Payer: BC Managed Care – PPO | Admitting: "Endocrinology

## 2014-08-16 VITALS — BP 117/74 | HR 65 | Wt 124.6 lb

## 2014-08-16 DIAGNOSIS — E890 Postprocedural hypothyroidism: Secondary | ICD-10-CM | POA: Diagnosis not present

## 2014-08-16 DIAGNOSIS — E211 Secondary hyperparathyroidism, not elsewhere classified: Secondary | ICD-10-CM

## 2014-08-16 DIAGNOSIS — E559 Vitamin D deficiency, unspecified: Secondary | ICD-10-CM | POA: Diagnosis not present

## 2014-08-16 DIAGNOSIS — M8580 Other specified disorders of bone density and structure, unspecified site: Secondary | ICD-10-CM

## 2014-08-16 DIAGNOSIS — IMO0002 Reserved for concepts with insufficient information to code with codable children: Secondary | ICD-10-CM

## 2014-08-16 NOTE — Patient Instructions (Signed)
Follow up visit in 6 months. 
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
## 2014-08-17 ENCOUNTER — Encounter: Payer: Self-pay | Admitting: "Endocrinology

## 2014-08-17 DIAGNOSIS — IMO0002 Reserved for concepts with insufficient information to code with codable children: Secondary | ICD-10-CM | POA: Insufficient documentation

## 2014-09-01 ENCOUNTER — Telehealth: Payer: Self-pay | Admitting: "Endocrinology

## 2014-09-01 DIAGNOSIS — E7800 Pure hypercholesterolemia, unspecified: Secondary | ICD-10-CM

## 2014-09-01 DIAGNOSIS — R7303 Prediabetes: Secondary | ICD-10-CM

## 2014-09-01 NOTE — Telephone Encounter (Signed)
1. I contacted Dr. Hageman re the labs drawn by Dr. ValentiEdwena Blowno Rios.   A. Her HbA1c was borderline pre-diabetic. She needs a C-peptide.   B. Her cholesterol was higher at 232.  Her HDL was nice and normal at 83, but her LDL cholesterol was higher at 131. She does take fish oil, but only intermittently. She does not want to take a statin, even pravastatin. She agreed to take fish oil daily.  C. Her TFTs were normal. Her CBC was normal.  D. Her yersinia IgM, IgA, and IgG were negative.  E. Her H. pylori tests were normal.  F. Her EBV early antigen Ab, IgG test was elevated at >150. Dr. Valentino Rios told her that this result may reflect an old infection. She felt that her recent dental infection might have been part of the problem. 2. I will order both a C-peptide and a lipid panel to be done before her next visit. Kristina StallBRENNAN,Kristina Rios

## 2014-09-15 ENCOUNTER — Encounter: Payer: Self-pay | Admitting: "Endocrinology

## 2014-10-19 ENCOUNTER — Other Ambulatory Visit: Payer: Self-pay | Admitting: "Endocrinology

## 2015-01-06 ENCOUNTER — Ambulatory Visit
Admission: RE | Admit: 2015-01-06 | Discharge: 2015-01-06 | Disposition: A | Discharge status: Home / Self Care | Payer: BLUE CROSS/BLUE SHIELD | Source: Ambulatory Visit | Attending: Internal Medicine | Admitting: Internal Medicine

## 2015-01-06 ENCOUNTER — Other Ambulatory Visit: Payer: Self-pay | Admitting: Internal Medicine

## 2015-01-06 DIAGNOSIS — M13139 Monoarthritis, not elsewhere classified, unspecified wrist: Secondary | ICD-10-CM

## 2015-02-14 ENCOUNTER — Ambulatory Visit: Payer: BC Managed Care – PPO | Admitting: "Endocrinology

## 2015-03-17 ENCOUNTER — Other Ambulatory Visit: Payer: Self-pay | Admitting: *Deleted

## 2015-03-17 DIAGNOSIS — E034 Atrophy of thyroid (acquired): Secondary | ICD-10-CM

## 2015-03-17 LAB — CALCIUM: Calcium: 9 mg/dL (ref 8.4–10.5)

## 2015-03-18 LAB — HEMOGLOBIN A1C
Hgb A1c MFr Bld: 5.4 % (ref <5.7)
MEAN PLASMA GLUCOSE: 108 mg/dL (ref <117)

## 2015-03-18 LAB — VITAMIN D 25 HYDROXY (VIT D DEFICIENCY, FRACTURES): Vit D, 25-Hydroxy: 61 ng/mL (ref 30–100)

## 2015-03-18 LAB — T4, FREE: FREE T4: 1.33 ng/dL (ref 0.80–1.80)

## 2015-03-18 LAB — T3, FREE: T3, Free: 2.8 pg/mL (ref 2.3–4.2)

## 2015-03-18 LAB — TSH: TSH: 1.095 u[IU]/mL (ref 0.350–4.500)

## 2015-03-18 LAB — C-PEPTIDE: C-Peptide: 1.97 ng/mL (ref 0.80–3.90)

## 2015-03-20 LAB — PTH, INTACT AND CALCIUM
CALCIUM: 9 mg/dL (ref 8.4–10.5)
PTH: 46 pg/mL (ref 14–64)

## 2015-03-21 ENCOUNTER — Ambulatory Visit (INDEPENDENT_AMBULATORY_CARE_PROVIDER_SITE_OTHER): Payer: BLUE CROSS/BLUE SHIELD | Admitting: "Endocrinology

## 2015-03-21 ENCOUNTER — Encounter: Payer: Self-pay | Admitting: "Endocrinology

## 2015-03-21 VITALS — BP 121/74 | HR 69 | Wt 116.5 lb

## 2015-03-21 DIAGNOSIS — M810 Age-related osteoporosis without current pathological fracture: Secondary | ICD-10-CM | POA: Insufficient documentation

## 2015-03-21 DIAGNOSIS — R634 Abnormal weight loss: Secondary | ICD-10-CM

## 2015-03-21 DIAGNOSIS — E211 Secondary hyperparathyroidism, not elsewhere classified: Secondary | ICD-10-CM

## 2015-03-21 DIAGNOSIS — E890 Postprocedural hypothyroidism: Secondary | ICD-10-CM

## 2015-03-21 DIAGNOSIS — E559 Vitamin D deficiency, unspecified: Secondary | ICD-10-CM

## 2015-03-21 DIAGNOSIS — R7309 Other abnormal glucose: Secondary | ICD-10-CM

## 2015-03-21 NOTE — Patient Instructions (Signed)
Follow up visit in 3 months. 
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
## 2015-03-22 DIAGNOSIS — R634 Abnormal weight loss: Secondary | ICD-10-CM | POA: Insufficient documentation

## 2015-03-24 LAB — FRUCTOSAMINE: Fructosamine: 250 umol/L (ref 190–270)

## 2015-03-28 ENCOUNTER — Encounter: Payer: Self-pay | Admitting: *Deleted

## 2015-04-15 ENCOUNTER — Other Ambulatory Visit: Payer: Self-pay | Admitting: "Endocrinology

## 2015-05-11 ENCOUNTER — Other Ambulatory Visit: Payer: Self-pay | Admitting: "Endocrinology

## 2015-06-22 ENCOUNTER — Encounter: Payer: Self-pay | Admitting: "Endocrinology

## 2015-06-22 ENCOUNTER — Ambulatory Visit (INDEPENDENT_AMBULATORY_CARE_PROVIDER_SITE_OTHER): Payer: BLUE CROSS/BLUE SHIELD | Admitting: "Endocrinology

## 2015-06-22 VITALS — BP 130/73 | HR 71 | Wt 114.4 lb

## 2015-06-22 DIAGNOSIS — E890 Postprocedural hypothyroidism: Secondary | ICD-10-CM | POA: Diagnosis not present

## 2015-06-22 DIAGNOSIS — N2581 Secondary hyperparathyroidism of renal origin: Secondary | ICD-10-CM | POA: Diagnosis not present

## 2015-06-22 DIAGNOSIS — R634 Abnormal weight loss: Secondary | ICD-10-CM | POA: Diagnosis not present

## 2015-06-22 DIAGNOSIS — E559 Vitamin D deficiency, unspecified: Secondary | ICD-10-CM | POA: Diagnosis not present

## 2015-06-22 DIAGNOSIS — M8580 Other specified disorders of bone density and structure, unspecified site: Secondary | ICD-10-CM
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
## 2015-06-22 NOTE — Patient Instructions (Signed)
Follow up visit in 3 months. 

## 2015-07-06 ENCOUNTER — Telehealth: Payer: Self-pay | Admitting: "Endocrinology

## 2015-07-06 ENCOUNTER — Other Ambulatory Visit: Payer: Self-pay | Admitting: *Deleted

## 2015-07-06 DIAGNOSIS — E034 Atrophy of thyroid (acquired): Secondary | ICD-10-CM

## 2015-07-06 MED ORDER — SYNTHROID 112 MCG PO TABS: ORAL_TABLET | ORAL | Status: DC

## 2015-07-06 NOTE — Telephone Encounter (Signed)
Sent via escribe

## 2015-08-22 LAB — PTH, INTACT AND CALCIUM
CALCIUM: 9 mg/dL (ref 8.4–10.5)
PTH: 36 pg/mL (ref 14–64)

## 2015-08-22 LAB — VITAMIN D 25 HYDROXY (VIT D DEFICIENCY, FRACTURES): VIT D 25 HYDROXY: 61 ng/mL (ref 30–100)

## 2015-08-22 LAB — TSH: TSH: 1.406 u[IU]/mL (ref 0.350–4.500)

## 2015-08-22 LAB — T3, FREE: T3, Free: 2.5 pg/mL (ref 2.3–4.2)

## 2015-08-22 LAB — T4, FREE: FREE T4: 1.63 ng/dL (ref 0.80–1.80)

## 2015-09-27 ENCOUNTER — Ambulatory Visit (INDEPENDENT_AMBULATORY_CARE_PROVIDER_SITE_OTHER): Payer: BLUE CROSS/BLUE SHIELD | Admitting: "Endocrinology

## 2015-09-27 ENCOUNTER — Encounter: Payer: Self-pay | Admitting: "Endocrinology

## 2015-09-27 VITALS — BP 103/71 | HR 64 | Ht 67.0 in | Wt 115.2 lb

## 2015-09-27 DIAGNOSIS — E890 Postprocedural hypothyroidism: Secondary | ICD-10-CM

## 2015-09-27 DIAGNOSIS — E559 Vitamin D deficiency, unspecified: Secondary | ICD-10-CM | POA: Diagnosis not present

## 2015-09-27 DIAGNOSIS — R634 Abnormal weight loss: Secondary | ICD-10-CM

## 2015-09-27 DIAGNOSIS — M8580 Other specified disorders of bone density and structure, unspecified site: Secondary | ICD-10-CM

## 2015-09-27 DIAGNOSIS — E211 Secondary hyperparathyroidism, not elsewhere classified: Secondary | ICD-10-CM | POA: Diagnosis not present
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
## 2015-09-27 NOTE — Patient Instructions (Signed)
Follow up visit in 3 months. Please repeat lab tests one week prior to next visit. 

## 2015-10-12 ENCOUNTER — Other Ambulatory Visit: Payer: Self-pay | Admitting: "Endocrinology

## 2015-12-27 ENCOUNTER — Ambulatory Visit: Payer: BLUE CROSS/BLUE SHIELD | Admitting: "Endocrinology

## 2016-01-19 LAB — TSH: TSH: 0.92 mIU/L

## 2016-01-19 LAB — T4, FREE: Free T4: 1.7 ng/dL (ref 0.8–1.8)

## 2016-01-19 LAB — T3, FREE: T3 FREE: 3 pg/mL (ref 2.3–4.2)

## 2016-01-22 ENCOUNTER — Encounter: Payer: Self-pay | Admitting: "Endocrinology

## 2016-01-22 ENCOUNTER — Ambulatory Visit (INDEPENDENT_AMBULATORY_CARE_PROVIDER_SITE_OTHER): Payer: BLUE CROSS/BLUE SHIELD | Admitting: "Endocrinology

## 2016-01-22 VITALS — BP 101/63 | HR 70 | Wt 118.2 lb

## 2016-01-22 DIAGNOSIS — E211 Secondary hyperparathyroidism, not elsewhere classified: Secondary | ICD-10-CM | POA: Diagnosis not present

## 2016-01-22 DIAGNOSIS — R634 Abnormal weight loss: Secondary | ICD-10-CM

## 2016-01-22 DIAGNOSIS — M8580 Other specified disorders of bone density and structure, unspecified site: Secondary | ICD-10-CM

## 2016-01-22 DIAGNOSIS — E559 Vitamin D deficiency, unspecified: Secondary | ICD-10-CM

## 2016-01-22 DIAGNOSIS — E890 Postprocedural hypothyroidism: Secondary | ICD-10-CM

## 2016-01-22 DIAGNOSIS — M810 Age-related osteoporosis without current pathological fracture: Secondary | ICD-10-CM

## 2016-01-22 NOTE — Patient Instructions (Signed)
Follow up visit in 3 months. Please repeat lab tests one week prior.  
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
## 2016-03-28 ENCOUNTER — Telehealth: Payer: Self-pay | Admitting: "Endocrinology

## 2016-03-28 NOTE — Telephone Encounter (Signed)
Routed to provider

## 2016-04-10 LAB — TSH: TSH: 1.25 mIU/L

## 2016-04-10 LAB — T3, FREE: T3 FREE: 2.5 pg/mL (ref 2.3–4.2)

## 2016-04-10 LAB — VITAMIN D 25 HYDROXY (VIT D DEFICIENCY, FRACTURES): Vit D, 25-Hydroxy: 73 ng/mL (ref 30–100)

## 2016-04-10 LAB — PTH, INTACT AND CALCIUM
Calcium: 9 mg/dL (ref 8.4–10.5)
PTH: 34 pg/mL (ref 14–64)

## 2016-04-10 LAB — T4, FREE: Free T4: 1.6 ng/dL (ref 0.8–1.8)

## 2016-04-22 ENCOUNTER — Ambulatory Visit (INDEPENDENT_AMBULATORY_CARE_PROVIDER_SITE_OTHER): Payer: BLUE CROSS/BLUE SHIELD | Admitting: "Endocrinology

## 2016-04-22 ENCOUNTER — Encounter: Payer: Self-pay | Admitting: "Endocrinology

## 2016-04-22 VITALS — BP 102/72 | HR 90 | Wt 115.4 lb

## 2016-04-22 DIAGNOSIS — E211 Secondary hyperparathyroidism, not elsewhere classified: Secondary | ICD-10-CM

## 2016-04-22 DIAGNOSIS — E559 Vitamin D deficiency, unspecified: Secondary | ICD-10-CM | POA: Diagnosis not present

## 2016-04-22 DIAGNOSIS — H5340 Unspecified visual field defects: Secondary | ICD-10-CM

## 2016-04-22 DIAGNOSIS — E890 Postprocedural hypothyroidism: Secondary | ICD-10-CM

## 2016-04-22 DIAGNOSIS — M810 Age-related osteoporosis without current pathological fracture: Secondary | ICD-10-CM

## 2016-04-22 DIAGNOSIS — R634 Abnormal weight loss: Secondary | ICD-10-CM

## 2016-04-22 NOTE — Patient Instructions (Signed)
Follow up visit in 6 months. Please repeat lab tests 1-2 weeks prior.  
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
## 2016-06-24 ENCOUNTER — Other Ambulatory Visit: Payer: Self-pay | Admitting: "Endocrinology

## 2016-08-28 ENCOUNTER — Other Ambulatory Visit (INDEPENDENT_AMBULATORY_CARE_PROVIDER_SITE_OTHER): Payer: Self-pay | Admitting: Otolaryngology

## 2016-08-28 DIAGNOSIS — J329 Chronic sinusitis, unspecified: Secondary | ICD-10-CM

## 2016-08-29 ENCOUNTER — Ambulatory Visit
Admission: RE | Admit: 2016-08-29 | Discharge: 2016-08-29 | Disposition: A | Discharge status: Home / Self Care | Payer: BLUE CROSS/BLUE SHIELD | Source: Ambulatory Visit | Attending: Otolaryngology | Admitting: Otolaryngology

## 2016-08-29 DIAGNOSIS — J329 Chronic sinusitis, unspecified: Secondary | ICD-10-CM

## 2016-10-18 ENCOUNTER — Other Ambulatory Visit (INDEPENDENT_AMBULATORY_CARE_PROVIDER_SITE_OTHER): Payer: Self-pay | Admitting: *Deleted

## 2016-10-18 DIAGNOSIS — E559 Vitamin D deficiency, unspecified: Secondary | ICD-10-CM

## 2016-10-21 LAB — VITAMIN D 25 HYDROXY (VIT D DEFICIENCY, FRACTURES): Vit D, 25-Hydroxy: 57 ng/mL (ref 30–100)

## 2016-10-23 ENCOUNTER — Ambulatory Visit (INDEPENDENT_AMBULATORY_CARE_PROVIDER_SITE_OTHER): Payer: Self-pay | Admitting: "Endocrinology

## 2016-10-24 ENCOUNTER — Ambulatory Visit (INDEPENDENT_AMBULATORY_CARE_PROVIDER_SITE_OTHER): Payer: Medicare Other | Admitting: "Endocrinology

## 2016-10-24 ENCOUNTER — Encounter (INDEPENDENT_AMBULATORY_CARE_PROVIDER_SITE_OTHER): Payer: Self-pay | Admitting: "Endocrinology

## 2016-10-24 VITALS — BP 112/60 | HR 80 | Ht 67.4 in | Wt 121.2 lb

## 2016-10-24 DIAGNOSIS — R634 Abnormal weight loss: Secondary | ICD-10-CM | POA: Diagnosis not present

## 2016-10-24 DIAGNOSIS — E559 Vitamin D deficiency, unspecified: Secondary | ICD-10-CM

## 2016-10-24 DIAGNOSIS — G609 Hereditary and idiopathic neuropathy, unspecified: Secondary | ICD-10-CM

## 2016-10-24 DIAGNOSIS — E890 Postprocedural hypothyroidism: Secondary | ICD-10-CM | POA: Diagnosis not present

## 2016-10-24 DIAGNOSIS — M8580 Other specified disorders of bone density and structure, unspecified site: Secondary | ICD-10-CM | POA: Diagnosis not present

## 2016-10-24 DIAGNOSIS — E211 Secondary hyperparathyroidism, not elsewhere classified: Secondary | ICD-10-CM | POA: Diagnosis not present

## 2016-10-24 DIAGNOSIS — H53453 Other localized visual field defect, bilateral: Secondary | ICD-10-CM

## 2016-10-24 LAB — T3, FREE: T3 FREE: 3.1 pg/mL (ref 2.3–4.2)

## 2016-10-24 LAB — T4, FREE: Free T4: 1.6 ng/dL (ref 0.8–1.8)

## 2016-10-24 LAB — TSH: TSH: 0.64 mIU/L
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
## 2016-10-24 NOTE — Patient Instructions (Signed)
Follow up visit in 6 months. 

## 2016-10-25 LAB — PTH, INTACT AND CALCIUM
CALCIUM: 9.8 mg/dL (ref 8.6–10.4)
PTH: 32 pg/mL (ref 14–64)

## 2016-10-25 LAB — FOLATE: Folate: 9.9 ng/mL (ref >5.4)

## 2016-10-25 LAB — VITAMIN B12: Vitamin B-12: 563 pg/mL (ref 200–1100)

## 2016-10-30 LAB — VITAMIN B6: VITAMIN B6: 264.3 ng/mL — AB (ref 2.1–21.7)

## 2016-11-29 DIAGNOSIS — M546 Pain in thoracic spine: Secondary | ICD-10-CM | POA: Diagnosis not present

## 2016-11-29 DIAGNOSIS — M25561 Pain in right knee: Secondary | ICD-10-CM | POA: Diagnosis not present

## 2016-11-29 DIAGNOSIS — M9903 Segmental and somatic dysfunction of lumbar region: Secondary | ICD-10-CM | POA: Diagnosis not present

## 2016-11-29 DIAGNOSIS — M9902 Segmental and somatic dysfunction of thoracic region: Secondary | ICD-10-CM | POA: Diagnosis not present

## 2016-11-29 DIAGNOSIS — M9904 Segmental and somatic dysfunction of sacral region: Secondary | ICD-10-CM | POA: Diagnosis not present

## 2016-12-13 ENCOUNTER — Telehealth (INDEPENDENT_AMBULATORY_CARE_PROVIDER_SITE_OTHER): Payer: Self-pay | Admitting: "Endocrinology

## 2016-12-13 ENCOUNTER — Other Ambulatory Visit (INDEPENDENT_AMBULATORY_CARE_PROVIDER_SITE_OTHER): Payer: Self-pay

## 2016-12-13 MED ORDER — SYNTHROID 112 MCG PO TABS: ORAL_TABLET | ORAL | 3 refills | Status: DC

## 2016-12-13 NOTE — Telephone Encounter (Signed)
°  Who's calling (name and relationship to patient) : Self Best contact number: [phone] Provider they see: Fransico MichaelBrennan Reason for call:     PRESCRIPTION REFILL ONLY  Name of prescription: Synthroid 112mg - 90 count bottle  Pharmacy: [facility] [address] [place]

## 2016-12-13 NOTE — Telephone Encounter (Signed)
Sent prescription in.

## 2016-12-19 ENCOUNTER — Other Ambulatory Visit: Payer: Self-pay | Admitting: "Endocrinology

## 2016-12-25 ENCOUNTER — Other Ambulatory Visit (INDEPENDENT_AMBULATORY_CARE_PROVIDER_SITE_OTHER): Payer: Self-pay | Admitting: "Endocrinology

## 2017-02-20 DIAGNOSIS — H25813 Combined forms of age-related cataract, bilateral: Secondary | ICD-10-CM | POA: Diagnosis not present

## 2017-02-20 DIAGNOSIS — H35371 Puckering of macula, right eye: Secondary | ICD-10-CM | POA: Diagnosis not present

## 2017-02-20 DIAGNOSIS — G43909 Migraine, unspecified, not intractable, without status migrainosus: Secondary | ICD-10-CM | POA: Diagnosis not present

## 2017-02-20 DIAGNOSIS — H43813 Vitreous degeneration, bilateral: Secondary | ICD-10-CM | POA: Diagnosis not present

## 2017-04-17 ENCOUNTER — Telehealth (INDEPENDENT_AMBULATORY_CARE_PROVIDER_SITE_OTHER): Payer: Self-pay | Admitting: "Endocrinology

## 2017-04-17 ENCOUNTER — Other Ambulatory Visit (INDEPENDENT_AMBULATORY_CARE_PROVIDER_SITE_OTHER): Payer: Self-pay | Admitting: *Deleted

## 2017-04-17 DIAGNOSIS — E559 Vitamin D deficiency, unspecified: Secondary | ICD-10-CM | POA: Diagnosis not present

## 2017-04-17 DIAGNOSIS — E890 Postprocedural hypothyroidism: Secondary | ICD-10-CM | POA: Diagnosis not present

## 2017-04-17 DIAGNOSIS — E211 Secondary hyperparathyroidism, not elsewhere classified: Secondary | ICD-10-CM | POA: Diagnosis not present

## 2017-04-17 DIAGNOSIS — G609 Hereditary and idiopathic neuropathy, unspecified: Secondary | ICD-10-CM | POA: Diagnosis not present

## 2017-04-17 DIAGNOSIS — R634 Abnormal weight loss: Secondary | ICD-10-CM | POA: Diagnosis not present

## 2017-04-17 NOTE — Telephone Encounter (Signed)
LVM to advise that lab order have been added. Also, Epic did not allow me to add three of the labs per Dr. Fransico MichaelBrennan, had to do them separate.pleas call us so that we can fax the labs the system did not accept.

## 2017-04-17 NOTE — Telephone Encounter (Signed)
  Who's calling (name and relationship to patient) : Natalia LeatherwoodKatherine, self  Best contact number: [phone]  Provider they see: Fransico MichaelBrennan   Reason for call: Natalia LeatherwoodKatherine called in [place] and [place] orders for her labs to be drawn.  Please call her back on [phone] and let her know when they have been entered.     PRESCRIPTION REFILL ONLY  Name of prescription:  Pharmacy:

## 2017-04-18 LAB — T3, FREE: T3 FREE: 3.2 pg/mL (ref 2.3–4.2)

## 2017-04-18 LAB — FOLATE: Folate: 10.4 ng/mL (ref >5.4)

## 2017-04-18 LAB — CALCIUM: Calcium: 9.4 mg/dL (ref 8.6–10.4)

## 2017-04-18 LAB — T4, FREE: FREE T4: 1.5 ng/dL (ref 0.8–1.8)

## 2017-04-18 LAB — PTH, INTACT AND CALCIUM
Calcium: 9.4 mg/dL (ref 8.6–10.4)
PTH: 46 pg/mL (ref 14–64)

## 2017-04-18 LAB — TSH: TSH: 0.71 mIU/L

## 2017-04-18 LAB — VITAMIN B12: Vitamin B-12: 630 pg/mL (ref 200–1100)

## 2017-04-21 LAB — VITAMIN B6: Vitamin B6: 196.1 ng/mL — ABNORMAL HIGH (ref 2.1–21.7)

## 2017-04-24 ENCOUNTER — Ambulatory Visit (INDEPENDENT_AMBULATORY_CARE_PROVIDER_SITE_OTHER): Payer: Medicare Other | Admitting: "Endocrinology

## 2017-04-24 VITALS — BP 112/68 | HR 64 | Wt 123.6 lb

## 2017-04-24 DIAGNOSIS — G609 Hereditary and idiopathic neuropathy, unspecified: Secondary | ICD-10-CM | POA: Diagnosis not present

## 2017-04-24 DIAGNOSIS — E890 Postprocedural hypothyroidism: Secondary | ICD-10-CM | POA: Diagnosis not present

## 2017-04-24 DIAGNOSIS — E559 Vitamin D deficiency, unspecified: Secondary | ICD-10-CM | POA: Diagnosis not present

## 2017-04-24 DIAGNOSIS — R634 Abnormal weight loss: Secondary | ICD-10-CM

## 2017-04-24 DIAGNOSIS — E208 Other hypoparathyroidism: Secondary | ICD-10-CM

## 2017-04-24 NOTE — Patient Instructions (Signed)
Follow up visit in 6 months. Please obtain blood tests about 2 weeks prior.
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
## 2017-04-26 ENCOUNTER — Encounter (INDEPENDENT_AMBULATORY_CARE_PROVIDER_SITE_OTHER): Payer: Self-pay | Admitting: "Endocrinology

## 2017-08-01 DIAGNOSIS — M9902 Segmental and somatic dysfunction of thoracic region: Secondary | ICD-10-CM | POA: Diagnosis not present

## 2017-08-01 DIAGNOSIS — M9904 Segmental and somatic dysfunction of sacral region: Secondary | ICD-10-CM | POA: Diagnosis not present

## 2017-08-01 DIAGNOSIS — M5030 Other cervical disc degeneration, unspecified cervical region: Secondary | ICD-10-CM | POA: Diagnosis not present

## 2017-08-01 DIAGNOSIS — M9901 Segmental and somatic dysfunction of cervical region: Secondary | ICD-10-CM | POA: Diagnosis not present

## 2017-08-01 DIAGNOSIS — M9903 Segmental and somatic dysfunction of lumbar region: Secondary | ICD-10-CM | POA: Diagnosis not present

## 2017-08-01 DIAGNOSIS — M5134 Other intervertebral disc degeneration, thoracic region: Secondary | ICD-10-CM | POA: Diagnosis not present

## 2017-08-04 DIAGNOSIS — M9901 Segmental and somatic dysfunction of cervical region: Secondary | ICD-10-CM | POA: Diagnosis not present

## 2017-08-04 DIAGNOSIS — M9903 Segmental and somatic dysfunction of lumbar region: Secondary | ICD-10-CM | POA: Diagnosis not present

## 2017-08-04 DIAGNOSIS — M9904 Segmental and somatic dysfunction of sacral region: Secondary | ICD-10-CM | POA: Diagnosis not present

## 2017-08-04 DIAGNOSIS — M5030 Other cervical disc degeneration, unspecified cervical region: Secondary | ICD-10-CM | POA: Diagnosis not present

## 2017-08-04 DIAGNOSIS — M5134 Other intervertebral disc degeneration, thoracic region: Secondary | ICD-10-CM | POA: Diagnosis not present

## 2017-08-04 DIAGNOSIS — M9902 Segmental and somatic dysfunction of thoracic region: Secondary | ICD-10-CM | POA: Diagnosis not present

## 2017-08-13 DIAGNOSIS — M5134 Other intervertebral disc degeneration, thoracic region: Secondary | ICD-10-CM | POA: Diagnosis not present

## 2017-08-13 DIAGNOSIS — M9904 Segmental and somatic dysfunction of sacral region: Secondary | ICD-10-CM | POA: Diagnosis not present

## 2017-08-13 DIAGNOSIS — M9901 Segmental and somatic dysfunction of cervical region: Secondary | ICD-10-CM | POA: Diagnosis not present

## 2017-08-13 DIAGNOSIS — M9903 Segmental and somatic dysfunction of lumbar region: Secondary | ICD-10-CM | POA: Diagnosis not present

## 2017-08-13 DIAGNOSIS — M5030 Other cervical disc degeneration, unspecified cervical region: Secondary | ICD-10-CM | POA: Diagnosis not present

## 2017-08-13 DIAGNOSIS — M9902 Segmental and somatic dysfunction of thoracic region: Secondary | ICD-10-CM | POA: Diagnosis not present

## 2017-08-26 ENCOUNTER — Other Ambulatory Visit: Payer: Self-pay | Admitting: "Endocrinology

## 2017-10-02 DIAGNOSIS — M25522 Pain in left elbow: Secondary | ICD-10-CM | POA: Diagnosis not present

## 2017-10-02 DIAGNOSIS — M62830 Muscle spasm of back: Secondary | ICD-10-CM | POA: Diagnosis not present

## 2017-10-02 DIAGNOSIS — M9901 Segmental and somatic dysfunction of cervical region: Secondary | ICD-10-CM | POA: Diagnosis not present

## 2017-10-02 DIAGNOSIS — M5413 Radiculopathy, cervicothoracic region: Secondary | ICD-10-CM | POA: Diagnosis not present

## 2017-10-02 DIAGNOSIS — M25512 Pain in left shoulder: Secondary | ICD-10-CM | POA: Diagnosis not present

## 2017-10-02 DIAGNOSIS — M546 Pain in thoracic spine: Secondary | ICD-10-CM | POA: Diagnosis not present

## 2017-10-06 DIAGNOSIS — M25522 Pain in left elbow: Secondary | ICD-10-CM | POA: Diagnosis not present

## 2017-10-06 DIAGNOSIS — M9901 Segmental and somatic dysfunction of cervical region: Secondary | ICD-10-CM | POA: Diagnosis not present

## 2017-10-06 DIAGNOSIS — M25512 Pain in left shoulder: Secondary | ICD-10-CM | POA: Diagnosis not present

## 2017-10-06 DIAGNOSIS — M62830 Muscle spasm of back: Secondary | ICD-10-CM | POA: Diagnosis not present

## 2017-10-06 DIAGNOSIS — M5413 Radiculopathy, cervicothoracic region: Secondary | ICD-10-CM | POA: Diagnosis not present

## 2017-10-06 DIAGNOSIS — M546 Pain in thoracic spine: Secondary | ICD-10-CM | POA: Diagnosis not present

## 2017-10-10 DIAGNOSIS — M25522 Pain in left elbow: Secondary | ICD-10-CM | POA: Diagnosis not present

## 2017-10-10 DIAGNOSIS — M546 Pain in thoracic spine: Secondary | ICD-10-CM | POA: Diagnosis not present

## 2017-10-10 DIAGNOSIS — M5413 Radiculopathy, cervicothoracic region: Secondary | ICD-10-CM | POA: Diagnosis not present

## 2017-10-10 DIAGNOSIS — M62830 Muscle spasm of back: Secondary | ICD-10-CM | POA: Diagnosis not present

## 2017-10-10 DIAGNOSIS — M9901 Segmental and somatic dysfunction of cervical region: Secondary | ICD-10-CM | POA: Diagnosis not present

## 2017-10-10 DIAGNOSIS — M25512 Pain in left shoulder: Secondary | ICD-10-CM | POA: Diagnosis not present

## 2017-10-13 ENCOUNTER — Other Ambulatory Visit (INDEPENDENT_AMBULATORY_CARE_PROVIDER_SITE_OTHER): Payer: Self-pay | Admitting: "Endocrinology

## 2017-10-20 DIAGNOSIS — E890 Postprocedural hypothyroidism: Secondary | ICD-10-CM | POA: Diagnosis not present

## 2017-10-20 DIAGNOSIS — M546 Pain in thoracic spine: Secondary | ICD-10-CM | POA: Diagnosis not present

## 2017-10-20 DIAGNOSIS — M9901 Segmental and somatic dysfunction of cervical region: Secondary | ICD-10-CM | POA: Diagnosis not present

## 2017-10-20 DIAGNOSIS — M25512 Pain in left shoulder: Secondary | ICD-10-CM | POA: Diagnosis not present

## 2017-10-20 DIAGNOSIS — M62830 Muscle spasm of back: Secondary | ICD-10-CM | POA: Diagnosis not present

## 2017-10-20 DIAGNOSIS — M25522 Pain in left elbow: Secondary | ICD-10-CM | POA: Diagnosis not present

## 2017-10-20 DIAGNOSIS — M5413 Radiculopathy, cervicothoracic region: Secondary | ICD-10-CM | POA: Diagnosis not present

## 2017-10-21 LAB — T4, FREE: FREE T4: 1.7 ng/dL (ref 0.8–1.8)

## 2017-10-21 LAB — TSH: TSH: 0.49 mIU/L (ref 0.40–4.50)

## 2017-10-21 LAB — T3, FREE: T3, Free: 3.4 pg/mL (ref 2.3–4.2)

## 2017-10-23 DIAGNOSIS — M9901 Segmental and somatic dysfunction of cervical region: Secondary | ICD-10-CM | POA: Diagnosis not present

## 2017-10-23 DIAGNOSIS — M5413 Radiculopathy, cervicothoracic region: Secondary | ICD-10-CM | POA: Diagnosis not present

## 2017-10-23 DIAGNOSIS — M62830 Muscle spasm of back: Secondary | ICD-10-CM | POA: Diagnosis not present

## 2017-10-23 DIAGNOSIS — M25522 Pain in left elbow: Secondary | ICD-10-CM | POA: Diagnosis not present

## 2017-10-23 DIAGNOSIS — M546 Pain in thoracic spine: Secondary | ICD-10-CM | POA: Diagnosis not present

## 2017-10-23 DIAGNOSIS — M25512 Pain in left shoulder: Secondary | ICD-10-CM | POA: Diagnosis not present

## 2017-10-27 ENCOUNTER — Ambulatory Visit (INDEPENDENT_AMBULATORY_CARE_PROVIDER_SITE_OTHER): Payer: Medicare Other | Admitting: "Endocrinology

## 2017-10-27 ENCOUNTER — Encounter (INDEPENDENT_AMBULATORY_CARE_PROVIDER_SITE_OTHER): Payer: Self-pay | Admitting: "Endocrinology

## 2017-10-27 VITALS — BP 90/60 | HR 68 | Ht 67.24 in | Wt 126.6 lb

## 2017-10-27 DIAGNOSIS — R634 Abnormal weight loss: Secondary | ICD-10-CM | POA: Diagnosis not present

## 2017-10-27 DIAGNOSIS — E890 Postprocedural hypothyroidism: Secondary | ICD-10-CM | POA: Diagnosis not present

## 2017-10-27 DIAGNOSIS — N2581 Secondary hyperparathyroidism of renal origin: Secondary | ICD-10-CM | POA: Diagnosis not present

## 2017-10-27 DIAGNOSIS — G609 Hereditary and idiopathic neuropathy, unspecified: Secondary | ICD-10-CM

## 2017-10-27 DIAGNOSIS — E559 Vitamin D deficiency, unspecified: Secondary | ICD-10-CM | POA: Diagnosis not present
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
## 2017-10-27 NOTE — Patient Instructions (Signed)
  Follow up visit in 6 months. Please repeat la b tests in two months.

## 2017-11-10 DIAGNOSIS — M546 Pain in thoracic spine: Secondary | ICD-10-CM | POA: Diagnosis not present

## 2017-11-10 DIAGNOSIS — M25522 Pain in left elbow: Secondary | ICD-10-CM | POA: Diagnosis not present

## 2017-11-10 DIAGNOSIS — M25512 Pain in left shoulder: Secondary | ICD-10-CM | POA: Diagnosis not present

## 2017-11-17 DIAGNOSIS — M546 Pain in thoracic spine: Secondary | ICD-10-CM | POA: Diagnosis not present

## 2017-11-17 DIAGNOSIS — M25512 Pain in left shoulder: Secondary | ICD-10-CM | POA: Diagnosis not present

## 2017-11-17 DIAGNOSIS — M25522 Pain in left elbow: Secondary | ICD-10-CM | POA: Diagnosis not present

## 2017-11-20 DIAGNOSIS — M546 Pain in thoracic spine: Secondary | ICD-10-CM | POA: Diagnosis not present

## 2017-11-20 DIAGNOSIS — M25522 Pain in left elbow: Secondary | ICD-10-CM | POA: Diagnosis not present

## 2017-11-20 DIAGNOSIS — M25512 Pain in left shoulder: Secondary | ICD-10-CM | POA: Diagnosis not present

## 2017-11-24 DIAGNOSIS — H43813 Vitreous degeneration, bilateral: Secondary | ICD-10-CM | POA: Diagnosis not present

## 2017-11-24 DIAGNOSIS — M25512 Pain in left shoulder: Secondary | ICD-10-CM | POA: Diagnosis not present

## 2017-11-24 DIAGNOSIS — M25522 Pain in left elbow: Secondary | ICD-10-CM | POA: Diagnosis not present

## 2017-11-24 DIAGNOSIS — H25813 Combined forms of age-related cataract, bilateral: Secondary | ICD-10-CM | POA: Diagnosis not present

## 2017-11-24 DIAGNOSIS — H35371 Puckering of macula, right eye: Secondary | ICD-10-CM | POA: Diagnosis not present

## 2017-11-24 DIAGNOSIS — M546 Pain in thoracic spine: Secondary | ICD-10-CM | POA: Diagnosis not present

## 2017-11-24 DIAGNOSIS — H5310 Unspecified subjective visual disturbances: Secondary | ICD-10-CM | POA: Diagnosis not present

## 2017-11-27 DIAGNOSIS — M546 Pain in thoracic spine: Secondary | ICD-10-CM | POA: Diagnosis not present

## 2017-11-27 DIAGNOSIS — M25522 Pain in left elbow: Secondary | ICD-10-CM | POA: Diagnosis not present

## 2017-11-27 DIAGNOSIS — M25512 Pain in left shoulder: Secondary | ICD-10-CM | POA: Diagnosis not present

## 2017-12-01 DIAGNOSIS — M25522 Pain in left elbow: Secondary | ICD-10-CM | POA: Diagnosis not present

## 2017-12-01 DIAGNOSIS — M25512 Pain in left shoulder: Secondary | ICD-10-CM | POA: Diagnosis not present

## 2017-12-01 DIAGNOSIS — M546 Pain in thoracic spine: Secondary | ICD-10-CM | POA: Diagnosis not present

## 2017-12-04 DIAGNOSIS — M546 Pain in thoracic spine: Secondary | ICD-10-CM | POA: Diagnosis not present

## 2017-12-04 DIAGNOSIS — M25512 Pain in left shoulder: Secondary | ICD-10-CM | POA: Diagnosis not present

## 2017-12-04 DIAGNOSIS — M25522 Pain in left elbow: Secondary | ICD-10-CM | POA: Diagnosis not present

## 2017-12-08 DIAGNOSIS — M25522 Pain in left elbow: Secondary | ICD-10-CM | POA: Diagnosis not present

## 2017-12-08 DIAGNOSIS — M25512 Pain in left shoulder: Secondary | ICD-10-CM | POA: Diagnosis not present

## 2017-12-08 DIAGNOSIS — M546 Pain in thoracic spine: Secondary | ICD-10-CM | POA: Diagnosis not present

## 2017-12-09 ENCOUNTER — Other Ambulatory Visit (INDEPENDENT_AMBULATORY_CARE_PROVIDER_SITE_OTHER): Payer: Self-pay | Admitting: *Deleted

## 2017-12-11 DIAGNOSIS — M546 Pain in thoracic spine: Secondary | ICD-10-CM | POA: Diagnosis not present

## 2017-12-11 DIAGNOSIS — M25522 Pain in left elbow: Secondary | ICD-10-CM | POA: Diagnosis not present

## 2017-12-11 DIAGNOSIS — M25512 Pain in left shoulder: Secondary | ICD-10-CM | POA: Diagnosis not present

## 2017-12-12 ENCOUNTER — Telehealth (INDEPENDENT_AMBULATORY_CARE_PROVIDER_SITE_OTHER): Payer: Self-pay | Admitting: *Deleted

## 2017-12-12 NOTE — Telephone Encounter (Signed)
Spoke to patient, advised that Dr. Fransico MichaelBrennan signed off on her PT plan, Rx sent to [facility], and Dr. Fransico MichaelBrennan has already talked to Dr. Wylene Simmerisovec and sent her notes; she needs to call and make an appt.

## 2017-12-15 DIAGNOSIS — M546 Pain in thoracic spine: Secondary | ICD-10-CM | POA: Diagnosis not present

## 2017-12-15 DIAGNOSIS — M25512 Pain in left shoulder: Secondary | ICD-10-CM | POA: Diagnosis not present

## 2017-12-15 DIAGNOSIS — M25522 Pain in left elbow: Secondary | ICD-10-CM | POA: Diagnosis not present

## 2017-12-18 DIAGNOSIS — M25512 Pain in left shoulder: Secondary | ICD-10-CM | POA: Diagnosis not present

## 2017-12-18 DIAGNOSIS — M25522 Pain in left elbow: Secondary | ICD-10-CM | POA: Diagnosis not present

## 2017-12-18 DIAGNOSIS — M546 Pain in thoracic spine: Secondary | ICD-10-CM | POA: Diagnosis not present

## 2017-12-22 DIAGNOSIS — M546 Pain in thoracic spine: Secondary | ICD-10-CM | POA: Diagnosis not present

## 2017-12-22 DIAGNOSIS — M25522 Pain in left elbow: Secondary | ICD-10-CM | POA: Diagnosis not present

## 2017-12-22 DIAGNOSIS — M25512 Pain in left shoulder: Secondary | ICD-10-CM | POA: Diagnosis not present

## 2017-12-29 DIAGNOSIS — M546 Pain in thoracic spine: Secondary | ICD-10-CM | POA: Diagnosis not present

## 2017-12-29 DIAGNOSIS — M25512 Pain in left shoulder: Secondary | ICD-10-CM | POA: Diagnosis not present

## 2017-12-29 DIAGNOSIS — M25522 Pain in left elbow: Secondary | ICD-10-CM | POA: Diagnosis not present

## 2018-01-07 DIAGNOSIS — M25512 Pain in left shoulder: Secondary | ICD-10-CM | POA: Diagnosis not present

## 2018-01-07 DIAGNOSIS — M546 Pain in thoracic spine: Secondary | ICD-10-CM | POA: Diagnosis not present

## 2018-01-07 DIAGNOSIS — M25522 Pain in left elbow: Secondary | ICD-10-CM | POA: Diagnosis not present

## 2018-01-21 DIAGNOSIS — M546 Pain in thoracic spine: Secondary | ICD-10-CM | POA: Diagnosis not present

## 2018-01-21 DIAGNOSIS — M25512 Pain in left shoulder: Secondary | ICD-10-CM | POA: Diagnosis not present

## 2018-01-21 DIAGNOSIS — M25522 Pain in left elbow: Secondary | ICD-10-CM | POA: Diagnosis not present

## 2018-01-29 DIAGNOSIS — M25512 Pain in left shoulder: Secondary | ICD-10-CM | POA: Diagnosis not present

## 2018-01-29 DIAGNOSIS — M25522 Pain in left elbow: Secondary | ICD-10-CM | POA: Diagnosis not present

## 2018-01-29 DIAGNOSIS — M546 Pain in thoracic spine: Secondary | ICD-10-CM | POA: Diagnosis not present

## 2018-02-11 DIAGNOSIS — M25522 Pain in left elbow: Secondary | ICD-10-CM | POA: Diagnosis not present

## 2018-02-11 DIAGNOSIS — M25512 Pain in left shoulder: Secondary | ICD-10-CM | POA: Diagnosis not present

## 2018-02-11 DIAGNOSIS — M546 Pain in thoracic spine: Secondary | ICD-10-CM | POA: Diagnosis not present

## 2018-03-24 DIAGNOSIS — M546 Pain in thoracic spine: Secondary | ICD-10-CM | POA: Diagnosis not present

## 2018-03-24 DIAGNOSIS — M25522 Pain in left elbow: Secondary | ICD-10-CM | POA: Diagnosis not present

## 2018-03-24 DIAGNOSIS — M25512 Pain in left shoulder: Secondary | ICD-10-CM | POA: Diagnosis not present

## 2018-04-13 DIAGNOSIS — M25512 Pain in left shoulder: Secondary | ICD-10-CM | POA: Diagnosis not present

## 2018-04-13 DIAGNOSIS — M25522 Pain in left elbow: Secondary | ICD-10-CM | POA: Diagnosis not present

## 2018-04-13 DIAGNOSIS — M546 Pain in thoracic spine: Secondary | ICD-10-CM | POA: Diagnosis not present

## 2018-04-29 ENCOUNTER — Ambulatory Visit (INDEPENDENT_AMBULATORY_CARE_PROVIDER_SITE_OTHER): Payer: Medicare Other | Admitting: "Endocrinology

## 2018-05-04 DIAGNOSIS — N2581 Secondary hyperparathyroidism of renal origin: Secondary | ICD-10-CM | POA: Diagnosis not present

## 2018-05-04 DIAGNOSIS — E559 Vitamin D deficiency, unspecified: Secondary | ICD-10-CM | POA: Diagnosis not present

## 2018-05-04 DIAGNOSIS — E890 Postprocedural hypothyroidism: Secondary | ICD-10-CM | POA: Diagnosis not present

## 2018-05-05 LAB — PTH, INTACT AND CALCIUM
Calcium: 9.9 mg/dL (ref 8.6–10.4)
PTH: 36 pg/mL (ref 14–64)

## 2018-05-05 LAB — T3, FREE: T3, Free: 2.6 pg/mL (ref 2.3–4.2)

## 2018-05-05 LAB — T4, FREE: FREE T4: 1.4 ng/dL (ref 0.8–1.8)

## 2018-05-05 LAB — TSH: TSH: 2.08 mIU/L (ref 0.40–4.50)

## 2018-05-05 LAB — VITAMIN D 25 HYDROXY (VIT D DEFICIENCY, FRACTURES): Vit D, 25-Hydroxy: 73 ng/mL (ref 30–100)

## 2018-05-11 ENCOUNTER — Ambulatory Visit (INDEPENDENT_AMBULATORY_CARE_PROVIDER_SITE_OTHER): Payer: Medicare Other | Admitting: "Endocrinology

## 2018-05-11 ENCOUNTER — Encounter (INDEPENDENT_AMBULATORY_CARE_PROVIDER_SITE_OTHER): Payer: Self-pay | Admitting: "Endocrinology

## 2018-05-11 VITALS — BP 106/64 | HR 64 | Ht 67.91 in | Wt 122.4 lb

## 2018-05-11 DIAGNOSIS — M818 Other osteoporosis without current pathological fracture: Secondary | ICD-10-CM | POA: Diagnosis not present

## 2018-05-11 DIAGNOSIS — E890 Postprocedural hypothyroidism: Secondary | ICD-10-CM | POA: Diagnosis not present

## 2018-05-11 DIAGNOSIS — E559 Vitamin D deficiency, unspecified: Secondary | ICD-10-CM | POA: Diagnosis not present

## 2018-05-11 DIAGNOSIS — N2581 Secondary hyperparathyroidism of renal origin: Secondary | ICD-10-CM

## 2018-05-11 MED ORDER — BECLOMETHASONE DIPROP MONOHYD 42 MCG/SPRAY NA SUSP: 2.0000 | Freq: Two times a day (BID) | NASAL | Status: DC
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
## 2018-05-11 NOTE — Patient Instructions (Signed)
Follow up visit in 6 months. Please obtain lab tests two weeks prior. 

## 2018-05-20 ENCOUNTER — Other Ambulatory Visit (INDEPENDENT_AMBULATORY_CARE_PROVIDER_SITE_OTHER): Payer: Self-pay | Admitting: "Endocrinology

## 2018-05-20 ENCOUNTER — Telehealth (INDEPENDENT_AMBULATORY_CARE_PROVIDER_SITE_OTHER): Payer: Self-pay | Admitting: "Endocrinology

## 2018-05-20 MED ORDER — BECLOMETHASONE DIPROP MONOHYD 42 MCG/SPRAY NA SUSP: 2.0000 | Freq: Two times a day (BID) | NASAL | 5 refills | Status: DC

## 2018-05-20 NOTE — Telephone Encounter (Signed)
°  Who's calling (name and relationship to patient) : self  Best contact number: [phone]  Provider they see: Dr Fransico MichaelBrennan  Reason for call: pt called stating that her pharmacy has not received rx for refill since last visit, would like to confirm rx was sent to pharmacy please.      PRESCRIPTION REFILL ONLY  Name of prescription: beclomethasone (BECONASE-AQ) nasal spray 2 spray    Pharmacy: [facility] [place]

## 2018-05-20 NOTE — Telephone Encounter (Signed)
Left a general message and let patient know if is taken care of.

## 2018-05-21 NOTE — Telephone Encounter (Signed)
Routing to on call adult provider while Dr. Fransico MichaelBrennan is out.

## 2018-05-25 ENCOUNTER — Ambulatory Visit (INDEPENDENT_AMBULATORY_CARE_PROVIDER_SITE_OTHER): Payer: Medicare Other | Admitting: Otolaryngology

## 2018-05-25 DIAGNOSIS — J310 Chronic rhinitis: Secondary | ICD-10-CM

## 2018-05-25 DIAGNOSIS — J343 Hypertrophy of nasal turbinates: Secondary | ICD-10-CM | POA: Diagnosis not present

## 2018-06-16 ENCOUNTER — Other Ambulatory Visit (INDEPENDENT_AMBULATORY_CARE_PROVIDER_SITE_OTHER): Payer: Self-pay | Admitting: "Endocrinology

## 2018-06-24 DIAGNOSIS — J343 Hypertrophy of nasal turbinates: Secondary | ICD-10-CM | POA: Diagnosis not present

## 2018-06-24 DIAGNOSIS — J323 Chronic sphenoidal sinusitis: Secondary | ICD-10-CM | POA: Diagnosis not present

## 2018-06-24 DIAGNOSIS — J321 Chronic frontal sinusitis: Secondary | ICD-10-CM | POA: Diagnosis not present

## 2018-07-16 DIAGNOSIS — M546 Pain in thoracic spine: Secondary | ICD-10-CM | POA: Diagnosis not present

## 2018-07-16 DIAGNOSIS — M25512 Pain in left shoulder: Secondary | ICD-10-CM | POA: Diagnosis not present

## 2018-07-16 DIAGNOSIS — M25522 Pain in left elbow: Secondary | ICD-10-CM | POA: Diagnosis not present

## 2018-07-31 DIAGNOSIS — M25512 Pain in left shoulder: Secondary | ICD-10-CM | POA: Diagnosis not present

## 2018-07-31 DIAGNOSIS — M546 Pain in thoracic spine: Secondary | ICD-10-CM | POA: Diagnosis not present

## 2018-07-31 DIAGNOSIS — M25522 Pain in left elbow: Secondary | ICD-10-CM | POA: Diagnosis not present

## 2018-08-13 DIAGNOSIS — M546 Pain in thoracic spine: Secondary | ICD-10-CM | POA: Diagnosis not present

## 2018-08-13 DIAGNOSIS — M25522 Pain in left elbow: Secondary | ICD-10-CM | POA: Diagnosis not present

## 2018-08-13 DIAGNOSIS — M25512 Pain in left shoulder: Secondary | ICD-10-CM | POA: Diagnosis not present

## 2018-08-20 DIAGNOSIS — M25512 Pain in left shoulder: Secondary | ICD-10-CM | POA: Diagnosis not present

## 2018-08-20 DIAGNOSIS — M25522 Pain in left elbow: Secondary | ICD-10-CM | POA: Diagnosis not present

## 2018-08-20 DIAGNOSIS — M546 Pain in thoracic spine: Secondary | ICD-10-CM | POA: Diagnosis not present

## 2018-09-07 DIAGNOSIS — M546 Pain in thoracic spine: Secondary | ICD-10-CM | POA: Diagnosis not present

## 2018-09-07 DIAGNOSIS — M25522 Pain in left elbow: Secondary | ICD-10-CM | POA: Diagnosis not present

## 2018-09-07 DIAGNOSIS — M25512 Pain in left shoulder: Secondary | ICD-10-CM | POA: Diagnosis not present

## 2018-09-21 ENCOUNTER — Other Ambulatory Visit: Payer: Self-pay | Admitting: Otolaryngology

## 2018-09-21 DIAGNOSIS — J329 Chronic sinusitis, unspecified: Secondary | ICD-10-CM

## 2018-09-24 ENCOUNTER — Ambulatory Visit
Admission: RE | Admit: 2018-09-24 | Discharge: 2018-09-24 | Disposition: A | Discharge status: Home / Self Care | Payer: Medicare Other | Source: Ambulatory Visit | Attending: Otolaryngology | Admitting: Otolaryngology

## 2018-09-24 DIAGNOSIS — M2602 Maxillary hypoplasia: Secondary | ICD-10-CM | POA: Diagnosis not present

## 2018-09-24 DIAGNOSIS — J329 Chronic sinusitis, unspecified: Secondary | ICD-10-CM

## 2018-09-26 ENCOUNTER — Other Ambulatory Visit: Payer: Self-pay | Admitting: "Endocrinology

## 2018-09-29 ENCOUNTER — Telehealth (INDEPENDENT_AMBULATORY_CARE_PROVIDER_SITE_OTHER): Payer: Self-pay

## 2018-09-29 NOTE — Telephone Encounter (Signed)
Received fax from pharmacy requesting a refill for Biest 8/2 (Estriol/Estradiol) HRT (topi) 1.75 (1.4-0.35) MG/ML. Dr. Fransico MichaelBrennan has never prescribed this medication, so asked him if he was comfortable being the prescribing physician for this. He states it was okay to send in the medication for the patient with the instructions 2 clicks twice daily. Gave the RX verbally to [facility]. They will contact the patient when it is ready for pick up.

## 2018-10-01 DIAGNOSIS — M25522 Pain in left elbow: Secondary | ICD-10-CM | POA: Diagnosis not present

## 2018-10-01 DIAGNOSIS — M25512 Pain in left shoulder: Secondary | ICD-10-CM | POA: Diagnosis not present

## 2018-10-01 DIAGNOSIS — M546 Pain in thoracic spine: Secondary | ICD-10-CM | POA: Diagnosis not present

## 2018-10-12 ENCOUNTER — Encounter (HOSPITAL_BASED_OUTPATIENT_CLINIC_OR_DEPARTMENT_OTHER): Payer: Self-pay | Admitting: *Deleted

## 2018-10-12 ENCOUNTER — Other Ambulatory Visit: Payer: Self-pay

## 2018-10-13 DIAGNOSIS — J323 Chronic sphenoidal sinusitis: Secondary | ICD-10-CM | POA: Diagnosis not present

## 2018-10-13 DIAGNOSIS — J321 Chronic frontal sinusitis: Secondary | ICD-10-CM | POA: Diagnosis not present

## 2018-10-13 DIAGNOSIS — J343 Hypertrophy of nasal turbinates: Secondary | ICD-10-CM | POA: Diagnosis not present

## 2018-10-14 ENCOUNTER — Encounter (INDEPENDENT_AMBULATORY_CARE_PROVIDER_SITE_OTHER): Payer: Self-pay

## 2018-10-15 DIAGNOSIS — M546 Pain in thoracic spine: Secondary | ICD-10-CM | POA: Diagnosis not present

## 2018-10-15 DIAGNOSIS — M25512 Pain in left shoulder: Secondary | ICD-10-CM | POA: Diagnosis not present

## 2018-10-15 DIAGNOSIS — M25522 Pain in left elbow: Secondary | ICD-10-CM | POA: Diagnosis not present

## 2018-10-16 ENCOUNTER — Other Ambulatory Visit: Payer: Self-pay | Admitting: Otolaryngology

## 2018-10-19 ENCOUNTER — Encounter (HOSPITAL_BASED_OUTPATIENT_CLINIC_OR_DEPARTMENT_OTHER): Payer: Self-pay

## 2018-10-19 ENCOUNTER — Ambulatory Visit (HOSPITAL_BASED_OUTPATIENT_CLINIC_OR_DEPARTMENT_OTHER)
Admission: RE | Admit: 2018-10-19 | Discharge: 2018-10-19 | Disposition: A | Discharge status: Home / Self Care | Payer: Medicare Other | Attending: Otolaryngology | Admitting: Otolaryngology

## 2018-10-19 ENCOUNTER — Ambulatory Visit (HOSPITAL_BASED_OUTPATIENT_CLINIC_OR_DEPARTMENT_OTHER): Payer: Medicare Other | Admitting: Anesthesiology

## 2018-10-19 ENCOUNTER — Other Ambulatory Visit: Payer: Self-pay

## 2018-10-19 ENCOUNTER — Encounter (HOSPITAL_BASED_OUTPATIENT_CLINIC_OR_DEPARTMENT_OTHER)
Admission: RE | Disposition: A | Discharge status: Home / Self Care | Payer: Self-pay | Source: Home / Self Care | Attending: Otolaryngology

## 2018-10-19 DIAGNOSIS — J019 Acute sinusitis, unspecified: Secondary | ICD-10-CM | POA: Diagnosis not present

## 2018-10-19 DIAGNOSIS — J323 Chronic sphenoidal sinusitis: Secondary | ICD-10-CM | POA: Insufficient documentation

## 2018-10-19 DIAGNOSIS — J328 Other chronic sinusitis: Secondary | ICD-10-CM | POA: Diagnosis not present

## 2018-10-19 DIAGNOSIS — J321 Chronic frontal sinusitis: Secondary | ICD-10-CM | POA: Diagnosis not present

## 2018-10-19 DIAGNOSIS — J310 Chronic rhinitis: Secondary | ICD-10-CM | POA: Diagnosis not present

## 2018-10-19 DIAGNOSIS — J338 Other polyp of sinus: Secondary | ICD-10-CM | POA: Diagnosis not present

## 2018-10-19 DIAGNOSIS — J329 Chronic sinusitis, unspecified: Secondary | ICD-10-CM | POA: Diagnosis not present

## 2018-10-19 DIAGNOSIS — J343 Hypertrophy of nasal turbinates: Secondary | ICD-10-CM | POA: Insufficient documentation

## 2018-10-19 DIAGNOSIS — J3489 Other specified disorders of nose and nasal sinuses: Secondary | ICD-10-CM | POA: Insufficient documentation

## 2018-10-19 HISTORY — PX: ENDOSCOPIC CONCHA BULLOSA RESECTION: SHX6395

## 2018-10-19 HISTORY — DX: Nausea with vomiting, unspecified: R11.2

## 2018-10-19 HISTORY — DX: Other specified postprocedural states: Z98.890

## 2018-10-19 HISTORY — DX: Other complications of anesthesia, initial encounter: T88.59XA

## 2018-10-19 HISTORY — PX: SINUS ENDO WITH FUSION: SHX5329

## 2018-10-19 HISTORY — PX: FRONTAL SINUS EXPLORATION: SHX6591

## 2018-10-19 HISTORY — PX: TURBINATE REDUCTION: SHX6157

## 2018-10-19 HISTORY — PX: SPHENOIDECTOMY: SHX2421

## 2018-10-19 HISTORY — DX: Adverse effect of unspecified anesthetic, initial encounter: T41.45XA

## 2018-10-19 HISTORY — DX: Hypertrophy of nasal turbinates: J34.3

## 2018-10-19 SURGERY — REDUCTION, NASAL TURBINATE
Anesthesia: General | Site: Nose | Laterality: Bilateral

## 2018-10-19 MED ORDER — ONDANSETRON HCL 4 MG/2ML IJ SOLN
INTRAMUSCULAR | Status: AC
  Filled 2018-10-19: qty 2

## 2018-10-19 MED ORDER — MIDAZOLAM HCL 2 MG/2ML IJ SOLN
1.0000 mg | INTRAMUSCULAR | Status: DC | PRN
  Administered 2018-10-19: 2 mg via INTRAVENOUS

## 2018-10-19 MED ORDER — SCOPOLAMINE 1 MG/3DAYS TD PT72
MEDICATED_PATCH | TRANSDERMAL | Status: AC
  Filled 2018-10-19: qty 1

## 2018-10-19 MED ORDER — EPHEDRINE SULFATE 50 MG/ML IJ SOLN
INTRAMUSCULAR | Status: DC | PRN
  Administered 2018-10-19 (×3): 10 mg via INTRAVENOUS

## 2018-10-19 MED ORDER — ROCURONIUM BROMIDE 100 MG/10ML IV SOLN
INTRAVENOUS | Status: DC | PRN
  Administered 2018-10-19: 40 mg via INTRAVENOUS

## 2018-10-19 MED ORDER — FENTANYL CITRATE (PF) 100 MCG/2ML IJ SOLN
50.0000 ug | INTRAMUSCULAR | Status: DC | PRN
  Administered 2018-10-19 (×2): 50 ug via INTRAVENOUS

## 2018-10-19 MED ORDER — PHENYLEPHRINE HCL 10 MG/ML IJ SOLN
INTRAMUSCULAR | Status: DC | PRN
  Administered 2018-10-19: 80 ug via INTRAVENOUS
  Administered 2018-10-19: 40 ug via INTRAVENOUS

## 2018-10-19 MED ORDER — ACETAMINOPHEN 325 MG PO TABS: 325.0000 mg | ORAL_TABLET | Freq: Once | ORAL | Status: DC

## 2018-10-19 MED ORDER — CLINDAMYCIN HCL 300 MG PO CAPS: 300.0000 mg | ORAL_CAPSULE | Freq: Three times a day (TID) | ORAL | 0 refills | Status: AC

## 2018-10-19 MED ORDER — LACTATED RINGERS IV SOLN: INTRAVENOUS | Status: DC

## 2018-10-19 MED ORDER — ACETAMINOPHEN 160 MG/5ML PO SOLN: 325.0000 mg | Freq: Once | ORAL | Status: DC

## 2018-10-19 MED ORDER — HYDROMORPHONE HCL 1 MG/ML IJ SOLN: 0.2500 mg | INTRAMUSCULAR | Status: DC | PRN

## 2018-10-19 MED ORDER — LIDOCAINE 2% (20 MG/ML) 5 ML SYRINGE
INTRAMUSCULAR | Status: AC
  Filled 2018-10-19: qty 5

## 2018-10-19 MED ORDER — OXYMETAZOLINE HCL 0.05 % NA SOLN
NASAL | Status: DC | PRN
  Administered 2018-10-19: 1 via TOPICAL

## 2018-10-19 MED ORDER — PROPOFOL 10 MG/ML IV BOLUS
INTRAVENOUS | Status: DC | PRN
  Administered 2018-10-19: 150 mg via INTRAVENOUS

## 2018-10-19 MED ORDER — DEXAMETHASONE SODIUM PHOSPHATE 4 MG/ML IJ SOLN
INTRAMUSCULAR | Status: DC | PRN
  Administered 2018-10-19: 10 mg via INTRAVENOUS

## 2018-10-19 MED ORDER — ACETAMINOPHEN 10 MG/ML IV SOLN: 1000.0000 mg | Freq: Once | INTRAVENOUS | Status: DC | PRN

## 2018-10-19 MED ORDER — EPHEDRINE SULFATE 50 MG/ML IJ SOLN: INTRAMUSCULAR | Status: DC | PRN

## 2018-10-19 MED ORDER — ONDANSETRON HCL 4 MG/2ML IJ SOLN
4.0000 mg | Freq: Once | INTRAMUSCULAR | Status: AC
  Administered 2018-10-19: 4 mg via INTRAVENOUS

## 2018-10-19 MED ORDER — LACTATED RINGERS IV SOLN
INTRAVENOUS | Status: DC
  Administered 2018-10-19 (×2): via INTRAVENOUS

## 2018-10-19 MED ORDER — CEFAZOLIN SODIUM-DEXTROSE 2-3 GM-%(50ML) IV SOLR
INTRAVENOUS | Status: DC | PRN
  Administered 2018-10-19: 2 g via INTRAVENOUS

## 2018-10-19 MED ORDER — PROMETHAZINE HCL 25 MG/ML IJ SOLN: 6.2500 mg | INTRAMUSCULAR | Status: DC | PRN

## 2018-10-19 MED ORDER — CEFAZOLIN SODIUM-DEXTROSE 2-4 GM/100ML-% IV SOLN
INTRAVENOUS | Status: AC
  Filled 2018-10-19: qty 100

## 2018-10-19 MED ORDER — AMOXICILLIN-POT CLAVULANATE 875-125 MG PO TABS: 1.0000 | ORAL_TABLET | Freq: Two times a day (BID) | ORAL | 0 refills | Status: AC

## 2018-10-19 MED ORDER — MUPIROCIN 2 % EX OINT
TOPICAL_OINTMENT | CUTANEOUS | Status: DC | PRN
  Administered 2018-10-19: 1 via TOPICAL

## 2018-10-19 MED ORDER — SUGAMMADEX SODIUM 200 MG/2ML IV SOLN
INTRAVENOUS | Status: DC | PRN
  Administered 2018-10-19: 120 mg via INTRAVENOUS

## 2018-10-19 MED ORDER — FENTANYL CITRATE (PF) 100 MCG/2ML IJ SOLN
INTRAMUSCULAR | Status: AC
  Filled 2018-10-19: qty 2

## 2018-10-19 MED ORDER — PROPOFOL 10 MG/ML IV BOLUS
INTRAVENOUS | Status: AC
  Filled 2018-10-19: qty 20

## 2018-10-19 MED ORDER — MIDAZOLAM HCL 2 MG/2ML IJ SOLN
INTRAMUSCULAR | Status: AC
  Filled 2018-10-19: qty 2

## 2018-10-19 MED ORDER — LIDOCAINE 2% (20 MG/ML) 5 ML SYRINGE
INTRAMUSCULAR | Status: DC | PRN
  Administered 2018-10-19: 60 mg via INTRAVENOUS

## 2018-10-19 MED ORDER — DEXAMETHASONE SODIUM PHOSPHATE 10 MG/ML IJ SOLN
INTRAMUSCULAR | Status: AC
  Filled 2018-10-19: qty 1

## 2018-10-19 MED ORDER — MEPERIDINE HCL 25 MG/ML IJ SOLN: 6.2500 mg | INTRAMUSCULAR | Status: DC | PRN

## 2018-10-19 MED ORDER — OXYCODONE-ACETAMINOPHEN 5-325 MG PO TABS: 1.0000 | ORAL_TABLET | ORAL | 0 refills | Status: DC | PRN

## 2018-10-19 MED ORDER — SODIUM CHLORIDE 0.9 % IR SOLN
Status: DC | PRN
  Administered 2018-10-19: 100 mL

## 2018-10-19 MED ORDER — SCOPOLAMINE 1 MG/3DAYS TD PT72
1.0000 | MEDICATED_PATCH | Freq: Once | TRANSDERMAL | Status: AC | PRN
  Administered 2018-10-19: 1 via TRANSDERMAL
  Administered 2018-10-19: 1.5 mg via TRANSDERMAL

## 2018-10-19 SURGICAL SUPPLY — 71 items
ATTRACTOMAT 16X20 MAGNETIC DRP (DRAPES) IMPLANT
BLADE RAD40 ROTATE 4M 4 5PK (BLADE) IMPLANT
BLADE RAD40 ROTATE 4M 4MM 5PK (BLADE)
BLADE RAD60 ROTATE M4 4 5PK (BLADE) IMPLANT
BLADE RAD60 ROTATE M4 4MM 5PK (BLADE)
BLADE ROTATE RAD 12 4 M4 (BLADE) IMPLANT
BLADE ROTATE RAD 12 4MM M4 (BLADE)
BLADE ROTATE RAD 40 4 M4 (BLADE) IMPLANT
BLADE ROTATE RAD 40 4MM M4 (BLADE)
BLADE ROTATE TRICUT 4MX13CM M4 (BLADE) ×1
BLADE ROTATE TRICUT 4X13 M4 (BLADE) ×2 IMPLANT
BLADE TRICUT ROTATE M4 4 5PK (BLADE) IMPLANT
BLADE TRICUT ROTATE M4 4MM 5PK (BLADE)
BUR HS RAD FRONTAL 3 (BURR) IMPLANT
BUR HS RAD FRONTAL 3MM (BURR)
CANISTER SUC SOCK COL 7IN (MISCELLANEOUS) ×6 IMPLANT
CANISTER SUCT 1200ML W/VALVE (MISCELLANEOUS) ×3 IMPLANT
COAGULATOR SUCT 6 FR SWTCH (ELECTROSURGICAL)
COAGULATOR SUCT 8FR VV (MISCELLANEOUS) ×2 IMPLANT
COAGULATOR SUCT SWTCH 10FR 6 (ELECTROSURGICAL) IMPLANT
COVER WAND RF STERILE (DRAPES) IMPLANT
DECANTER SPIKE VIAL GLASS SM (MISCELLANEOUS) IMPLANT
DRSG NASAL KENNEDY LMNT 8CM (GAUZE/BANDAGES/DRESSINGS) IMPLANT
DRSG NASOPORE 8CM (GAUZE/BANDAGES/DRESSINGS) ×2 IMPLANT
DRSG TELFA 3X8 NADH (GAUZE/BANDAGES/DRESSINGS) IMPLANT
ELECT REM PT RETURN 9FT ADLT (ELECTROSURGICAL) ×3
ELECTRODE REM PT RTRN 9FT ADLT (ELECTROSURGICAL) ×1 IMPLANT
GLOVE BIO SURGEON STRL SZ7.5 (GLOVE) ×3 IMPLANT
GLOVE BIOGEL PI IND STRL 7.0 (GLOVE) IMPLANT
GLOVE BIOGEL PI INDICATOR 7.0 (GLOVE) ×2
GLOVE ECLIPSE 6.5 STRL STRAW (GLOVE) ×2 IMPLANT
GOWN STRL REUS W/ TWL LRG LVL3 (GOWN DISPOSABLE) ×2 IMPLANT
GOWN STRL REUS W/TWL LRG LVL3 (GOWN DISPOSABLE) ×6
HEMOSTAT SURGICEL 2X14 (HEMOSTASIS) IMPLANT
IV NS 1000ML (IV SOLUTION)
IV NS 1000ML BAXH (IV SOLUTION) IMPLANT
IV NS 500ML (IV SOLUTION) ×3
IV NS 500ML BAXH (IV SOLUTION) ×1 IMPLANT
IV SET EXT 30 76VOL 4 MALE LL (IV SETS) IMPLANT
NDL HYPO 25X1 1.5 SAFETY (NEEDLE) ×1 IMPLANT
NDL PRECISIONGLIDE 27X1.5 (NEEDLE) ×1 IMPLANT
NDL SPNL 25GX3.5 QUINCKE BL (NEEDLE) IMPLANT
NEEDLE HYPO 25X1 1.5 SAFETY (NEEDLE) ×3 IMPLANT
NEEDLE PRECISIONGLIDE 27X1.5 (NEEDLE) IMPLANT
NEEDLE SPNL 25GX3.5 QUINCKE BL (NEEDLE) IMPLANT
NS IRRIG 1000ML POUR BTL (IV SOLUTION) ×3 IMPLANT
PACK BASIN DAY SURGERY FS (CUSTOM PROCEDURE TRAY) ×3 IMPLANT
PACK ENT DAY SURGERY (CUSTOM PROCEDURE TRAY) ×3 IMPLANT
PACKING NASAL EPIS 4X2.4 XEROG (MISCELLANEOUS) IMPLANT
PAD DRESSING TELFA 3X8 NADH (GAUZE/BANDAGES/DRESSINGS) IMPLANT
PATTIES SURGICAL .5 X3 (DISPOSABLE) IMPLANT
SLEEVE SCD COMPRESS KNEE MED (MISCELLANEOUS) ×2 IMPLANT
SOLUTION BUTLER CLEAR DIP (MISCELLANEOUS) ×3 IMPLANT
SPLINT NASAL AIRWAY SILICONE (MISCELLANEOUS) IMPLANT
SPONGE GAUZE 2X2 8PLY STER LF (GAUZE/BANDAGES/DRESSINGS) ×1
SPONGE GAUZE 2X2 8PLY STRL LF (GAUZE/BANDAGES/DRESSINGS) ×2 IMPLANT
SPONGE NEURO XRAY DETECT 1X3 (DISPOSABLE) ×3 IMPLANT
SUCTION FRAZIER HANDLE 10FR (MISCELLANEOUS)
SUCTION TUBE FRAZIER 10FR DISP (MISCELLANEOUS) IMPLANT
SUT CHROMIC 4 0 P 3 18 (SUTURE) ×1 IMPLANT
SUT ETHILON 3 0 PS 1 (SUTURE) IMPLANT
SUT PLAIN 4 0 ~~LOC~~ 1 (SUTURE) IMPLANT
SYR 50ML LL SCALE MARK (SYRINGE) ×3 IMPLANT
TOWEL GREEN STERILE FF (TOWEL DISPOSABLE) ×3 IMPLANT
TRACKER ENT INSTRUMENT (MISCELLANEOUS) ×3 IMPLANT
TRACKER ENT PATIENT (MISCELLANEOUS) ×3 IMPLANT
TUBE CONNECTING 20'X1/4 (TUBING) ×1
TUBE CONNECTING 20X1/4 (TUBING) ×2 IMPLANT
TUBE SALEM SUMP 16 FR W/ARV (TUBING) IMPLANT
TUBING STRAIGHTSHOT EPS 5PK (TUBING) ×3 IMPLANT
YANKAUER SUCT BULB TIP NO VENT (SUCTIONS) ×3 IMPLANT

## 2018-10-19 NOTE — Discharge Instructions (Signed)

## 2018-10-19 NOTE — Anesthesia Postprocedure Evaluation (Signed)
Anesthesia Post Note  Patient: Kristina Rios  Procedure(s) Performed: BILATERAL TURBINATE REDUCTION (Bilateral Nose) ENDOSCOPIC BILATERAL CONCHA BULLOSA RESECTION (Bilateral Nose) BILATERAL FRONTAL RECESS EXPLORATION (Bilateral Nose) BILATERAL SPHENOIDECTOMY WITH TISSUE REMOVAL (Bilateral Nose) SINUS ENDOSCOPY WITH FUSION NAVIGATION (Bilateral Nose)     Patient location during evaluation: PACU Anesthesia Type: General Level of consciousness: awake and alert and oriented Pain management: pain level controlled Vital Signs Assessment: post-procedure vital signs reviewed and stable Respiratory status: spontaneous breathing, nonlabored ventilation and respiratory function stable Cardiovascular status: blood pressure returned to baseline and stable Postop Assessment: no apparent nausea or vomiting Anesthetic complications: no    Last Vitals:  Vitals:   10/19/18 1145 10/19/18 1200  BP: 122/73 115/70  Pulse: 78 77  Resp: 14 15  Temp:    SpO2: 100% 100%    Last Pain:  Vitals:   10/19/18 1200  TempSrc:   PainSc: 3                  Delva Derden A.

## 2018-10-19 NOTE — Anesthesia Procedure Notes (Signed)
Procedure Name: Intubation Date/Time: 10/19/2018 9:24 AM Performed by: Maryella Shivers, CRNA Pre-anesthesia Checklist: Patient identified, Emergency Drugs available, Suction available and Patient being monitored Patient Re-evaluated:Patient Re-evaluated prior to induction Oxygen Delivery Method: Circle system utilized Preoxygenation: Pre-oxygenation with 100% oxygen Induction Type: IV induction Ventilation: Mask ventilation without difficulty Laryngoscope Size: Mac and 3 Tube type: Oral Rae Tube size: 7.0 mm Number of attempts: 1 Airway Equipment and Method: Stylet and Oral airway Placement Confirmation: ETT inserted through vocal cords under direct vision,  positive ETCO2 and breath sounds checked- equal and bilateral Secured at: 22 cm Tube secured with: Tape Dental Injury: Teeth and Oropharynx as per pre-operative assessment

## 2018-10-19 NOTE — Anesthesia Preprocedure Evaluation (Addendum)
Anesthesia Evaluation  Patient identified by MRN, date of birth, ID band Patient awake    Reviewed: Allergy & Precautions, NPO status , Patient's Chart, lab work & pertinent test results  History of Anesthesia Complications (+) PONV  Airway Mallampati: I  TM Distance: >3 FB Neck ROM: Full    Dental  (+) Teeth Intact, Dental Advisory Given   Pulmonary neg pulmonary ROS,    breath sounds clear to auscultation       Cardiovascular negative cardio ROS   Rhythm:Regular Rate:Normal     Neuro/Psych  Neuromuscular disease negative psych ROS   GI/Hepatic negative GI ROS, Neg liver ROS,   Endo/Other  negative endocrine ROSHypothyroidism   Renal/GU negative Renal ROS     Musculoskeletal negative musculoskeletal ROS (+)   Abdominal Normal abdominal exam  (+)   Peds  Hematology negative hematology ROS (+)   Anesthesia Other Findings   Reproductive/Obstetrics                            Anesthesia Physical Anesthesia Plan  ASA: II  Anesthesia Plan: General   Post-op Pain Management:    Induction: Intravenous  PONV Risk Score and Plan: 4 or greater and Ondansetron, Dexamethasone, Midazolam and Scopolamine patch - Pre-op  Airway Management Planned: Oral ETT  Additional Equipment: None  Intra-op Plan:   Post-operative Plan: Extubation in OR  Informed Consent: I have reviewed the patients History and Physical, chart, labs and discussed the procedure including the risks, benefits and alternatives for the proposed anesthesia with the patient or authorized representative who has indicated his/her understanding and acceptance.     Dental advisory given  Plan Discussed with: CRNA  Anesthesia Plan Comments:        Anesthesia Quick Evaluation

## 2018-10-19 NOTE — Op Note (Signed)
DATE OF PROCEDURE: 10/19/2018  OPERATIVE REPORT   SURGEON: Newman Pies, MD   PREOPERATIVE DIAGNOSES:  1. Bilateral chronic frontal and sphenoid sinusitis. 2. Chronic nasal obstruction.  3. Bilateral inferior turbinate hypertrophy.  4. Bilateral concha bullosa.  POSTOPERATIVE DIAGNOSES:  1. Bilateral chronic frontal and sphenoid sinusitis. 2. Chronic nasal obstruction.  3. Bilateral inferior turbinate hypertrophy.  4. Bilateral concha bullosa.  PROCEDURE PERFORMED:  1. Bilateral endoscopic frontal sinusotomy with tissue removal. 2. Bilateral endoscopic sphenoidotomy with tissue removal. 3. Bilateral partial inferior turbinate resection.  4. Bilateral endoscopic concha bullosa resection. 5. FUSION stereotactic image guidance.  ANESTHESIA: General endotracheal tube anesthesia.   COMPLICATIONS: None.   ESTIMATED BLOOD LOSS:  INDICATION FOR PROCEDURE :Kristina Rios is a 68 y.o. female with a history of bilateral chronic rhinosinusitis and chronic nasal obstruction. The patient was treated with multiple courses of antibiotics, steroid nasal spray, and allergy medications. However, the patient continued to be symptomatic. On examination, the patient was noted to have bilateral severe inferior turbinate hypertrophy and conchal bullosa, causing significant nasal obstruction. Her CT scan showed significant opacifications of her bilateral frontal and sphenoid sinuses. Based on the above findings, the decision was made for the patient to undergo the above-stated procedures. The risks, benefits, alternatives, and details of the procedures were discussed with the patient. Questions were invited and answered. Informed consent was obtained.   DESCRIPTION: The patient was taken to the operating room and placed supine on the operating table. General endotracheal tube anesthesia was administered by the anesthesiologist. The patient was positioned and prepped and draped in a standard fashion for  nasal surgery. Pledgets soaked with Afrin were placed in both nasal cavities. The pledgets were subsequently removed. The FUSION stereotactic image guidance marker was placed. The image guidance system was functional throughout the case.   Examination of the nasal cavities revealed bilateral severe inferior turbinate hypertrophy and bilateral concha bullosa. The inferior one-half of each inferior turbinate was then crossclamped with a straight Kelly clamp. The inferior one-half of each inferior turbinate was resected with a pair of cross cutting scissors. Using a 0 scope, the middle turbinates were visualized. The lateral half of each concha bullosa was resected with a Tru-Cut forceps. Hemostasis was achieved with suction electrocautery, under direct visual guidance of the zero-degree endoscope. Good hemostasis was achieved.    Attention was then focused on the left paranasal sinuses. Using a 0 endoscope, the left nasal cavity was examined. The left middle turbinate was medialized. The uncinate process was resected with a freer elevator. The frontal recess was identified. The bony partitions surrounding the frontal recess were taken down. The frontal sinus was entered. Polypoid tissue was removed from the frontal sinus. Attention was then focused on the sphenoid sinus. The bony partitions of the anterior and posterior ethmoid cavities were taken down, in order to visualize the sphenoid opening. The left sphenoid opening was entered and enlarged using a Kerrison rongeur. A large amount of fungal ball-like substance was noted within the left sphenoid sinus. All the fungus ball-like substance was evacuated. The frontal and sphenoid sinuses were copiously irrigated. Nasopore packing was placed.  The same procedure was repeated on the right side without exceptions. Similar findings were again noted.  The care of the patient was turned over to the anesthesiologist. The patient was awakened from anesthesia without  difficulty. The patient was extubated and transferred to the recovery room in good condition.   OPERATIVE FINDINGS: Bilateral inferior turbinate hypertrophy and bilateral concha  bullosa. Bilateral chronic frontal and sphenoid sinusitis, with possible fungus ball.  SPECIMEN: Bilateral sinus contents.   FOLLOWUP CARE: The patient will be discharged home once she is awake and alert. The patient will be placed on percocet 1 tablet p.o. q.4 h. p.r.n. pain, and Augmentin 875 mg p.o. b.i.d. for 5 days. The patient will follow up in my office in approximately 1 week.   Newman PiesSu Emalene Welte, MD

## 2018-10-19 NOTE — Transfer of Care (Signed)
Immediate Anesthesia Transfer of Care Note  Patient: Kristina Rios  Procedure(s) Performed: BILATERAL TURBINATE REDUCTION (Bilateral Nose) ENDOSCOPIC BILATERAL CONCHA BULLOSA RESECTION (Bilateral Nose) BILATERAL FRONTAL RECESS EXPLORATION (Bilateral Nose) BILATERAL SPHENOIDECTOMY WITH TISSUE REMOVAL (Bilateral Nose) SINUS ENDOSCOPY WITH FUSION NAVIGATION (Bilateral Nose)  Patient Location: PACU  Anesthesia Type:General  Level of Consciousness: awake, alert , oriented and drowsy  Airway & Oxygen Therapy: Patient Spontanous Breathing and Patient connected to face mask oxygen  Post-op Assessment: Report given to RN and Post -op Vital signs reviewed and stable  Post vital signs: Reviewed and stable  Last Vitals:  Vitals Value Taken Time  BP 116/72 10/19/2018 11:28 AM  Temp    Pulse 88 10/19/2018 11:29 AM  Resp 18 10/19/2018 11:29 AM  SpO2 100 % 10/19/2018 11:29 AM  Vitals shown include unvalidated device data.  Last Pain:  Vitals:   10/19/18 0734  TempSrc: Oral  PainSc: 0-No pain         Complications: No apparent anesthesia complications

## 2018-10-19 NOTE — H&P (Signed)
Cc: Recurrent sinusitis, chronic nasal obstruction  HPI: The patient is a 68 year old female who presents today for follow-up evaluation.  The patient was last seen 4 weeks ago.  At that time, she was noted to have acute exacerbations of her bilateral chronic rhinosinusitis.  The patient was also noted to have bilateral chronic nasal obstruction, secondary to her inferior turbinate hypertrophy and bilateral concha bullosa.  The patient was treated with Levaquin, Qnasl, and saline irrigation.  According to the patient, her facial pain has decreased.  She is still experiencing bilateral nasal congestion.  She reports occasional mid face and periorbital pain.  She denies any fever, purulent drainage or visual change.   Exam: The nasal cavities were decongested and anesthetised with a combination of oxymetazoline and 4% lidocaine solution.  The flexible scope was inserted into the right nasal cavity.  Endoscopy of the inferior and middle meatus was performed.  Edematous mucosa was noted.  No polyp, mass, or lesion was appreciated.  Olfactory cleft was clear.  Nasopharynx was clear.  Turbinates were hypertrophied but without mass.  Incomplete response to decongestion.  The procedure was repeated on the contralateral side with similar findings.  The patient tolerated the procedure well.  Instructions were given to avoid eating or drinking for 2 hours.    Assessment: 1.  Chronic rhinitis with nasal mucosal congestion, bilateral inferior turbinate hypertrophy, and bilateral concha bullosa.  2.  Chronic rhinosinusitis.  Her previous CT showed involvement of her sphenoid and frontal sinuses.   Plan: 1.  The nasal endoscopy findings are reviewed with the patient.  2.  Continue Qnasl nasal spray and daily saline irrigation.  3.  Based on her chronic symptoms, she may benefit from surgical intervention, with endoscopic sinus surgery, bilateral inferior turbinate reduction, and bilateral conchal bullosa resection.    4.  The patient is interested in proceeding with the procedures.

## 2018-10-20 ENCOUNTER — Encounter (HOSPITAL_BASED_OUTPATIENT_CLINIC_OR_DEPARTMENT_OTHER): Payer: Self-pay | Admitting: Otolaryngology

## 2018-10-27 DIAGNOSIS — J323 Chronic sphenoidal sinusitis: Secondary | ICD-10-CM | POA: Diagnosis not present

## 2018-10-27 DIAGNOSIS — J338 Other polyp of sinus: Secondary | ICD-10-CM | POA: Diagnosis not present

## 2018-10-27 DIAGNOSIS — J321 Chronic frontal sinusitis: Secondary | ICD-10-CM | POA: Diagnosis not present

## 2018-11-04 DIAGNOSIS — N2581 Secondary hyperparathyroidism of renal origin: Secondary | ICD-10-CM | POA: Diagnosis not present

## 2018-11-04 DIAGNOSIS — E890 Postprocedural hypothyroidism: Secondary | ICD-10-CM | POA: Diagnosis not present

## 2018-11-04 DIAGNOSIS — E559 Vitamin D deficiency, unspecified: Secondary | ICD-10-CM | POA: Diagnosis not present

## 2018-11-10 DIAGNOSIS — J323 Chronic sphenoidal sinusitis: Secondary | ICD-10-CM | POA: Diagnosis not present

## 2018-11-10 DIAGNOSIS — J321 Chronic frontal sinusitis: Secondary | ICD-10-CM | POA: Diagnosis not present

## 2018-11-10 DIAGNOSIS — J338 Other polyp of sinus: Secondary | ICD-10-CM | POA: Diagnosis not present

## 2018-11-10 LAB — VITAMIN D 1,25 DIHYDROXY
VITAMIN D 1, 25 (OH) TOTAL: 48 pg/mL (ref 18–72)
Vitamin D3 1, 25 (OH)2: 48 pg/mL

## 2018-11-10 LAB — T4, FREE: FREE T4: 1.5 ng/dL (ref 0.8–1.8)

## 2018-11-10 LAB — TSH: TSH: 0.43 mIU/L (ref 0.40–4.50)

## 2018-11-10 LAB — T3, FREE: T3 FREE: 3.2 pg/mL (ref 2.3–4.2)

## 2018-11-10 LAB — PTH, INTACT AND CALCIUM
Calcium: 9 mg/dL (ref 8.6–10.4)
PTH: 27 pg/mL (ref 14–64)

## 2018-11-11 ENCOUNTER — Ambulatory Visit (INDEPENDENT_AMBULATORY_CARE_PROVIDER_SITE_OTHER): Payer: Medicare Other | Admitting: "Endocrinology

## 2018-11-11 ENCOUNTER — Encounter (INDEPENDENT_AMBULATORY_CARE_PROVIDER_SITE_OTHER): Payer: Self-pay | Admitting: "Endocrinology

## 2018-11-11 VITALS — BP 116/62 | HR 56 | Wt 128.2 lb

## 2018-11-11 DIAGNOSIS — G609 Hereditary and idiopathic neuropathy, unspecified: Secondary | ICD-10-CM | POA: Diagnosis not present

## 2018-11-11 DIAGNOSIS — E559 Vitamin D deficiency, unspecified: Secondary | ICD-10-CM | POA: Diagnosis not present

## 2018-11-11 DIAGNOSIS — N2581 Secondary hyperparathyroidism of renal origin: Secondary | ICD-10-CM | POA: Diagnosis not present

## 2018-11-11 DIAGNOSIS — E890 Postprocedural hypothyroidism: Secondary | ICD-10-CM

## 2018-11-11 DIAGNOSIS — R634 Abnormal weight loss: Secondary | ICD-10-CM | POA: Diagnosis not present
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
## 2018-11-11 NOTE — Patient Instructions (Signed)
Follow up visit in 6 months. Please repeat lab tests in 3 months and again about one week prior to next visit.  

## 2018-11-25 DIAGNOSIS — H25813 Combined forms of age-related cataract, bilateral: Secondary | ICD-10-CM | POA: Diagnosis not present

## 2018-11-25 DIAGNOSIS — H35371 Puckering of macula, right eye: Secondary | ICD-10-CM | POA: Diagnosis not present

## 2018-11-25 DIAGNOSIS — H5213 Myopia, bilateral: Secondary | ICD-10-CM | POA: Diagnosis not present

## 2018-11-25 DIAGNOSIS — H43813 Vitreous degeneration, bilateral: Secondary | ICD-10-CM | POA: Diagnosis not present

## 2018-12-01 DIAGNOSIS — J338 Other polyp of sinus: Secondary | ICD-10-CM | POA: Diagnosis not present

## 2018-12-01 DIAGNOSIS — J321 Chronic frontal sinusitis: Secondary | ICD-10-CM | POA: Diagnosis not present

## 2018-12-01 DIAGNOSIS — J323 Chronic sphenoidal sinusitis: Secondary | ICD-10-CM | POA: Diagnosis not present

## 2018-12-16 ENCOUNTER — Telehealth (INDEPENDENT_AMBULATORY_CARE_PROVIDER_SITE_OTHER): Payer: Self-pay | Admitting: "Endocrinology

## 2018-12-16 NOTE — Telephone Encounter (Signed)
°  Who's calling (name and relationship to patient) : Lemya, Stdenis Best contact number: [phone] Provider they see: Fransico Michael Reason for call: [facility] informed pt that Progesterone 50 mg was rejected.   Please advise    PRESCRIPTION REFILL ONLY  Name of prescription:  Pharmacy: [facility] [phone]

## 2018-12-16 NOTE — Telephone Encounter (Signed)
Returned TC to [name] to advise that Dr. Fransico Michael, did not prescribe this medication, so we cannot send a refill. She said that was prescribed by DR. Zachery Dauer who is now retired, was told that he can continue giving her refills. Advised I will let Dr. Fransico Michael know.

## 2018-12-21 ENCOUNTER — Other Ambulatory Visit (INDEPENDENT_AMBULATORY_CARE_PROVIDER_SITE_OTHER): Payer: Self-pay | Admitting: *Deleted

## 2018-12-21 NOTE — Telephone Encounter (Signed)
Kristina Rios called in to follow up on this recent call regarding the Progesterone. She has advised she would like a call back at [phone] as soon as possible.

## 2018-12-22 NOTE — Telephone Encounter (Signed)
Spoke with Dr. Fransico Michael, if ok to approve this medication. He said yes and pharmacy to re-send in the request.  Received from pharmacy.

## 2019-02-10 ENCOUNTER — Telehealth: Payer: Medicare Other | Admitting: Nurse Practitioner

## 2019-02-10 DIAGNOSIS — Z20822 Contact with and (suspected) exposure to covid-19: Secondary | ICD-10-CM

## 2019-02-10 NOTE — Progress Notes (Signed)
E-Visit for Corona Virus Screening  Based on your current symptoms, you may very well have the virus, however your symptoms are mild. Currently, not all patients are being tested. If the symptoms are mild and there is not a known exposure, performing the test is not indicated.  You have been enrolled in MyChart Home Monitoring for COVID-19. Daily you will receive a questionnaire within the MyChart website. Our COVID-19 response team will be monitoring your responses daily.   Coronavirus disease 2019 (COVID-19) is a respiratory illness that can spread from person to person. The virus that causes COVID-19 is a new virus that was first identified in the country of [place] but is now found in multiple other countries and has spread to the [place].  Symptoms associated with the virus are mild to severe fever, cough, and shortness of breath. There is currently no vaccine to protect against COVID-19, and there is no specific antiviral treatment for the virus.   To be considered HIGH RISK for Coronavirus (COVID-19), you have to meet the following criteria:  . Traveled to [place], [place], [place], [place] or [place]; or in the [place] to [place], [place], [place], or [place]; and have fever, cough, and shortness of breath within the last 2 weeks of travel OR  . Been in close contact with a person diagnosed with COVID-19 within the last 2 weeks and have fever, cough, and shortness of breath  . IF YOU DO NOT MEET THESE CRITERIA, YOU ARE CONSIDERED LOW RISK FOR COVID-19.   It is vitally important that if you feel that you have an infection such as this virus or any other virus that you stay home and away from places where you may spread it to others.  You should self-quarantine for 14 days if you have symptoms that could potentially be coronavirus and avoid contact with people age 65 and older.   You can use medication such as mucinex or delsym if develop a cough.  You may also take  acetaminophen (Tylenol) as needed for fever.   Reduce your risk of any infection by using the same precautions used for avoiding the common cold or flu:  Marland Kitchen Wash your hands often with soap and warm water for at least 20 seconds.  If soap and water are not readily available, use an alcohol-based hand sanitizer with at least 60% alcohol.  . If coughing or sneezing, cover your mouth and nose by coughing or sneezing into the elbow areas of your shirt or coat, into a tissue or into your sleeve (not your hands). . Avoid shaking hands with others and consider head nods or verbal greetings only. . Avoid touching your eyes, nose, or mouth with unwashed hands.  . Avoid close contact with people who are sick. . Avoid places or events with large numbers of people in one location, like concerts or sporting events. . Carefully consider travel plans you have or are making. . If you are planning any travel outside or inside the [place], visit the CDC's Travelers' Health webpage for the latest health notices. . If you have some symptoms but not all symptoms, continue to monitor at home and seek medical attention if your symptoms worsen. . If you are having a medical emergency, call 911.  HOME CARE . Only take medications as instructed by your medical team. . Drink plenty of fluids and get plenty of rest. . A steam or ultrasonic humidifier can help if you have congestion.   GET  HELP RIGHT AWAY IF: . You develop worsening fever. . You become short of breath . You cough up blood. . Your symptoms become more severe MAKE SURE YOU   Understand these instructions.  Will watch your condition.  Will get help right away if you are not doing well or get worse.  Your e-visit answers were reviewed by a board certified advanced clinical practitioner to complete your personal care plan.  Depending on the condition, your plan could have included both over the counter or prescription medications.  If there is a problem  please reply once you have received a response from your provider. Your safety is important to us.  If you have drug allergies check your prescription carefully.    You can use MyChart to ask questions about today's visit, request a non-urgent call back, or ask for a work or school excuse for 24 hours related to this e-Visit. If it has been greater than 24 hours you will need to follow up with your provider, or enter a new e-Visit to address those concerns. You will get an e-mail in the next two days asking about your experience.  I hope that your e-visit has been valuable and will speed your recovery. Thank you for using e-visits.   5-10 minutes spent reviewing and documenting in chart.

## 2019-02-18 ENCOUNTER — Encounter (INDEPENDENT_AMBULATORY_CARE_PROVIDER_SITE_OTHER): Payer: Self-pay

## 2019-04-14 DIAGNOSIS — M25512 Pain in left shoulder: Secondary | ICD-10-CM | POA: Diagnosis not present

## 2019-04-14 DIAGNOSIS — M546 Pain in thoracic spine: Secondary | ICD-10-CM | POA: Diagnosis not present

## 2019-04-14 DIAGNOSIS — M25522 Pain in left elbow: Secondary | ICD-10-CM | POA: Diagnosis not present

## 2019-04-23 DIAGNOSIS — M25512 Pain in left shoulder: Secondary | ICD-10-CM | POA: Diagnosis not present

## 2019-04-23 DIAGNOSIS — M25522 Pain in left elbow: Secondary | ICD-10-CM | POA: Diagnosis not present

## 2019-04-23 DIAGNOSIS — M546 Pain in thoracic spine: Secondary | ICD-10-CM | POA: Diagnosis not present

## 2019-05-05 DIAGNOSIS — E890 Postprocedural hypothyroidism: Secondary | ICD-10-CM | POA: Diagnosis not present

## 2019-05-05 DIAGNOSIS — N2581 Secondary hyperparathyroidism of renal origin: Secondary | ICD-10-CM | POA: Diagnosis not present

## 2019-05-05 DIAGNOSIS — E559 Vitamin D deficiency, unspecified: Secondary | ICD-10-CM | POA: Diagnosis not present

## 2019-05-06 LAB — T3, FREE: T3, Free: 3.1 pg/mL (ref 2.3–4.2)

## 2019-05-06 LAB — PTH, INTACT AND CALCIUM
Calcium: 9.4 mg/dL (ref 8.6–10.4)
PTH: 40 pg/mL (ref 14–64)

## 2019-05-06 LAB — VITAMIN D 25 HYDROXY (VIT D DEFICIENCY, FRACTURES): Vit D, 25-Hydroxy: 74 ng/mL (ref 30–100)

## 2019-05-06 LAB — T4, FREE: Free T4: 1.5 ng/dL (ref 0.8–1.8)

## 2019-05-06 LAB — TSH: TSH: 0.55 mIU/L (ref 0.40–4.50)

## 2019-05-13 ENCOUNTER — Encounter (INDEPENDENT_AMBULATORY_CARE_PROVIDER_SITE_OTHER): Payer: Self-pay | Admitting: "Endocrinology

## 2019-05-13 ENCOUNTER — Ambulatory Visit (INDEPENDENT_AMBULATORY_CARE_PROVIDER_SITE_OTHER): Payer: Medicare Other | Admitting: "Endocrinology

## 2019-05-13 ENCOUNTER — Other Ambulatory Visit: Payer: Self-pay

## 2019-05-13 VITALS — BP 114/72 | HR 84 | Wt 121.8 lb

## 2019-05-13 DIAGNOSIS — E208 Other hypoparathyroidism: Secondary | ICD-10-CM | POA: Diagnosis not present

## 2019-05-13 DIAGNOSIS — G609 Hereditary and idiopathic neuropathy, unspecified: Secondary | ICD-10-CM | POA: Diagnosis not present

## 2019-05-13 DIAGNOSIS — E559 Vitamin D deficiency, unspecified: Secondary | ICD-10-CM

## 2019-05-13 DIAGNOSIS — E890 Postprocedural hypothyroidism: Secondary | ICD-10-CM

## 2019-05-13 DIAGNOSIS — R634 Abnormal weight loss: Secondary | ICD-10-CM

## 2019-05-13 NOTE — Patient Instructions (Signed)
Follow up visit in 6 months. Please repeat lab tests 1-2 weeks prior.  
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
## 2019-05-18 ENCOUNTER — Encounter (INDEPENDENT_AMBULATORY_CARE_PROVIDER_SITE_OTHER): Payer: Self-pay | Admitting: *Deleted

## 2019-06-04 ENCOUNTER — Telehealth (INDEPENDENT_AMBULATORY_CARE_PROVIDER_SITE_OTHER): Payer: Self-pay | Admitting: "Endocrinology

## 2019-06-04 NOTE — Telephone Encounter (Signed)
  Who's calling (name and relationship to patient) : Dr. Clearance Coots, patient  Best contact number: [phone]  Provider they see: Dr. Tobe Sos  Reason for call: Patient states that the pharmacy sent [place] an authorization request and we denied it. It was for the medication called Estradiol -Estriol-Progesterone (Biest/Progesterone). States it's been filled many times in the past before and shouldn't have been denied. Requests that we call to speak with her about this. Please advise.      PRESCRIPTION REFILL ONLY  Name of prescription:  Pharmacy:

## 2019-06-04 NOTE — Telephone Encounter (Signed)
This RN attempted to call [name] back to gain additional clarification on refill request. Pharmacy listed ([facility] in [place]) was also called but they do not have this prescription on fill as needing to be refilled for the patient. Also need additional clarification on if Dr. Tobe Sos was the original MD that prescribed the medication as no provider is listed as the prescriber in chart for Estradiol-Estriol-Progesterone.

## 2019-06-08 ENCOUNTER — Other Ambulatory Visit: Payer: Self-pay

## 2019-06-08 ENCOUNTER — Telehealth (INDEPENDENT_AMBULATORY_CARE_PROVIDER_SITE_OTHER): Payer: Self-pay | Admitting: Radiology

## 2019-06-08 DIAGNOSIS — R634 Abnormal weight loss: Secondary | ICD-10-CM

## 2019-06-08 MED ORDER — BIEST/PROGESTERONE TD CREA: 0.5000 mg | TOPICAL_CREAM | Freq: Every day | TRANSDERMAL | 0 refills | Status: DC | PRN

## 2019-06-08 NOTE — Telephone Encounter (Signed)
  Who's calling (name and relationship to patient) : Dr Mosetta Pigeon   Best contact number: [phone]  Provider they see: Dr Tobe Sos   Reason for call: Dr Mosetta Pigeon called in regarding this cream Estradiol- Mardee Postin stating that it is a special compound creme. This was specifically made. We should be having this filled at the pharmacy below. Please advise Dr Mosetta Pigeon when this is complete   [place]  Name of prescription: [place]: [place]  [phone] Phone

## 2019-06-08 NOTE — Telephone Encounter (Signed)
Error

## 2019-06-09 ENCOUNTER — Other Ambulatory Visit (INDEPENDENT_AMBULATORY_CARE_PROVIDER_SITE_OTHER): Payer: Self-pay | Admitting: *Deleted

## 2019-06-09 DIAGNOSIS — J321 Chronic frontal sinusitis: Secondary | ICD-10-CM | POA: Diagnosis not present

## 2019-06-09 DIAGNOSIS — J338 Other polyp of sinus: Secondary | ICD-10-CM | POA: Diagnosis not present

## 2019-06-09 DIAGNOSIS — J323 Chronic sphenoidal sinusitis: Secondary | ICD-10-CM | POA: Diagnosis not present

## 2019-06-09 DIAGNOSIS — R634 Abnormal weight loss: Secondary | ICD-10-CM

## 2019-06-10 ENCOUNTER — Telehealth (INDEPENDENT_AMBULATORY_CARE_PROVIDER_SITE_OTHER): Payer: Self-pay | Admitting: "Endocrinology

## 2019-06-10 NOTE — Telephone Encounter (Signed)
  Who's calling (name and relationship to patient) : Gwinda Passe, Pharmacist  Best contact number: [phone]  Provider they see: Dr. Tobe Sos  Reason for call: We sent a refill request for biest cream, the new prescription states biest w/progesterone, patient was not expecting a change. Usually the prescription is just biest cream so pharmacy wants to know , are we just trying to refill what she has been getting( biest cream) or is there a change in the medication from biest cream to biest w/progesterone. If there is a change we need a strength for the biest w/progesterone. Please advise.     PRESCRIPTION REFILL ONLY  Name of prescription:  Pharmacy:

## 2019-06-10 NOTE — Telephone Encounter (Signed)
Spoke with pharmacy. They will fill her regular cream shes been getting.

## 2019-07-05 DIAGNOSIS — M546 Pain in thoracic spine: Secondary | ICD-10-CM | POA: Diagnosis not present

## 2019-07-05 DIAGNOSIS — M25512 Pain in left shoulder: Secondary | ICD-10-CM | POA: Diagnosis not present

## 2019-07-05 DIAGNOSIS — M25522 Pain in left elbow: Secondary | ICD-10-CM | POA: Diagnosis not present

## 2019-07-06 ENCOUNTER — Other Ambulatory Visit: Payer: Self-pay | Admitting: "Endocrinology

## 2019-07-13 DIAGNOSIS — M25512 Pain in left shoulder: Secondary | ICD-10-CM | POA: Diagnosis not present

## 2019-07-13 DIAGNOSIS — M546 Pain in thoracic spine: Secondary | ICD-10-CM | POA: Diagnosis not present

## 2019-07-13 DIAGNOSIS — M25522 Pain in left elbow: Secondary | ICD-10-CM | POA: Diagnosis not present

## 2019-07-27 DIAGNOSIS — M25522 Pain in left elbow: Secondary | ICD-10-CM | POA: Diagnosis not present

## 2019-07-27 DIAGNOSIS — M546 Pain in thoracic spine: Secondary | ICD-10-CM | POA: Diagnosis not present

## 2019-07-27 DIAGNOSIS — M25512 Pain in left shoulder: Secondary | ICD-10-CM | POA: Diagnosis not present

## 2019-08-12 DIAGNOSIS — M25522 Pain in left elbow: Secondary | ICD-10-CM | POA: Diagnosis not present

## 2019-08-12 DIAGNOSIS — M25512 Pain in left shoulder: Secondary | ICD-10-CM | POA: Diagnosis not present

## 2019-08-12 DIAGNOSIS — M546 Pain in thoracic spine: Secondary | ICD-10-CM | POA: Diagnosis not present

## 2019-11-09 LAB — LIPID PANEL
Cholesterol: 207 mg/dL — ABNORMAL HIGH (ref <200)
HDL: 75 mg/dL (ref >=50)
LDL Cholesterol (Calc): 107 mg/dL (calc) — ABNORMAL HIGH
Non-HDL Cholesterol (Calc): 132 mg/dL (calc) — ABNORMAL HIGH (ref <130)
Total CHOL/HDL Ratio: 2.8 (calc) (ref <5.0)
Triglycerides: 139 mg/dL (ref <150)

## 2019-11-09 LAB — COMPREHENSIVE METABOLIC PANEL
AG Ratio: 2.2 (calc) (ref 1.0–2.5)
ALT: 14 U/L (ref 6–29)
AST: 18 U/L (ref 10–35)
Albumin: 4.6 g/dL (ref 3.6–5.1)
Alkaline phosphatase (APISO): 55 U/L (ref 37–153)
BUN: 14 mg/dL (ref 7–25)
CO2: 31 mmol/L (ref 20–32)
Calcium: 9.6 mg/dL (ref 8.6–10.4)
Chloride: 102 mmol/L (ref 98–110)
Creat: 0.78 mg/dL (ref 0.50–0.99)
Globulin: 2.1 g/dL (calc) (ref 1.9–3.7)
Glucose, Bld: 94 mg/dL (ref 65–139)
Potassium: 4.9 mmol/L (ref 3.5–5.3)
Sodium: 139 mmol/L (ref 135–146)
Total Bilirubin: 0.4 mg/dL (ref 0.2–1.2)
Total Protein: 6.7 g/dL (ref 6.1–8.1)

## 2019-11-09 LAB — CBC WITH DIFFERENTIAL/PLATELET
Absolute Monocytes: 504 cells/uL (ref 200–950)
Basophils Absolute: 48 cells/uL (ref 0–200)
Basophils Relative: 0.6 %
Eosinophils Absolute: 384 cells/uL (ref 15–500)
Eosinophils Relative: 4.8 %
HCT: 40.3 % (ref 35.0–45.0)
Hemoglobin: 13.3 g/dL (ref 11.7–15.5)
Lymphs Abs: 2368 cells/uL (ref 850–3900)
MCH: 30.5 pg (ref 27.0–33.0)
MCHC: 33 g/dL (ref 32.0–36.0)
MCV: 92.4 fL (ref 80.0–100.0)
MPV: 10.4 fL (ref 7.5–12.5)
Monocytes Relative: 6.3 %
Neutro Abs: 4696 cells/uL (ref 1500–7800)
Neutrophils Relative %: 58.7 %
Platelets: 248 10*3/uL (ref 140–400)
RBC: 4.36 10*6/uL (ref 3.80–5.10)
RDW: 11.6 % (ref 11.0–15.0)
Total Lymphocyte: 29.6 %
WBC: 8 10*3/uL (ref 3.8–10.8)

## 2019-11-09 LAB — PTH, INTACT AND CALCIUM
Calcium: 9.6 mg/dL (ref 8.6–10.4)
PTH: 39 pg/mL (ref 14–64)

## 2019-11-09 LAB — VITAMIN D 25 HYDROXY (VIT D DEFICIENCY, FRACTURES): Vit D, 25-Hydroxy: 59 ng/mL (ref 30–100)

## 2019-11-09 LAB — TSH: TSH: 0.58 mIU/L (ref 0.40–4.50)

## 2019-11-09 LAB — T4, FREE: Free T4: 1.6 ng/dL (ref 0.8–1.8)

## 2019-11-09 LAB — T3, FREE: T3, Free: 3.5 pg/mL (ref 2.3–4.2)

## 2019-11-12 ENCOUNTER — Encounter (INDEPENDENT_AMBULATORY_CARE_PROVIDER_SITE_OTHER): Payer: Self-pay | Admitting: *Deleted

## 2019-11-12 ENCOUNTER — Telehealth (INDEPENDENT_AMBULATORY_CARE_PROVIDER_SITE_OTHER): Payer: Self-pay | Admitting: "Endocrinology

## 2019-11-12 NOTE — Telephone Encounter (Signed)
  Who's calling (name and relationship to patient) : [place], Parran "Dr. Rosalio Loud" Best contact number: [phone] Provider they see: Fransico Michael Reason for call:  Inessa would like to know if she should repeat her labs fasting before her upcoming appointment on Monday.  Please call.    PRESCRIPTION REFILL ONLY  Name of prescription:  Pharmacy:

## 2019-11-12 NOTE — Telephone Encounter (Signed)
Mychart message sent.

## 2019-11-15 ENCOUNTER — Ambulatory Visit (INDEPENDENT_AMBULATORY_CARE_PROVIDER_SITE_OTHER): Payer: Medicare Other | Admitting: "Endocrinology

## 2019-11-15 ENCOUNTER — Other Ambulatory Visit: Payer: Self-pay

## 2019-11-15 ENCOUNTER — Encounter (INDEPENDENT_AMBULATORY_CARE_PROVIDER_SITE_OTHER): Payer: Self-pay | Admitting: "Endocrinology

## 2019-11-15 VITALS — BP 100/64 | HR 74 | Ht 67.32 in | Wt 120.4 lb

## 2019-11-15 DIAGNOSIS — E890 Postprocedural hypothyroidism: Secondary | ICD-10-CM | POA: Diagnosis not present

## 2019-11-15 DIAGNOSIS — N2581 Secondary hyperparathyroidism of renal origin: Secondary | ICD-10-CM

## 2019-11-15 DIAGNOSIS — E559 Vitamin D deficiency, unspecified: Secondary | ICD-10-CM

## 2019-11-15 NOTE — Patient Instructions (Signed)
Follow up visit in 6 months. Please repeat lab tests 1-2 weeks prior.  

## 2019-11-15 NOTE — Progress Notes (Signed)
Subjective:  Patient Name: Kristina Rios Date of Birth: [birth year]  MRN: [id number]  Kristina Rios  presents to the office today for follow-up of her post-surgical hypothyroidism, osteopenia, secondary hyperparathyroidism, vitamin D deficiency, hypocalcemia, arthropathy, and neuropathy of her right leg and foot secondary to a right ACL repair.  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 Caucasian woman.  [name] was unaccompanied.  1. [name] was first referred to me on 02/21/05 by her former primary care provider, Dr. Tommie Ard Baxley, for low thyroid stimulating hormone level. The patient was 69 years old.  A. Lab data on 09/22/03 showed a TSH of 2.241. However, lab data on 10/12/04 showed a TSH of 0.043 and a free T4 of 1.26. Follow up lab tests on 11/3004 showed a TSH of 0.004 and a free T4 of 1.48. The TPO antibody was 297.3, consistent with Hashimoto's disease.  B. When she had her first visit with me, she had had a recent sinus infection and URI and did not feel well. She had some problems with insomnia and early awakening. She woke up hot every morning, but that had not changed in 20 years. She noticed an occasional irregular heart beat. She also noticed herself feeling somewhat jittery and shaky over the past year. She was also feeling anxious at times. Her periods were regular. Past medical history was positive for a diagnosis of osteopenia made 2 years previously. She had Mnire's disease. She also had seasonal allergies. Surgical history was prominent for 2 knee surgeries, tonsils, and adenoids. 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 Her family history was positive for hypothyroidism and osteopenia in her mother. Her father and paternal grandfather had heart disease.  C. On physical examination, her weight was 129.6 pounds at a height of 5 feet 7-1/2 inches. Her BMI  was 19.9. Her blood pressure was 124/80. Her heart rate was 75. She looked like the slender and fit athlete that she was. She was alert and oriented to person place and time. Her affect and insight were fine. She had no acute distress. She had a tender left maxillary sinus. She had a 20+ gram thyroid gland. The thyroid gland was mildly, but diffusely enlarged. Thyroid gland was nontender. She had 1+ tremor of her hands. She had 2+ palmar moisture. Laboratory data showed a TSH of 0.022. Her free T4 was 1.26. Her free T3 was 3.9. Her TSI level was 107 (normal 0-129).  D. The patient had an active left maxillary sinusitis, which I treated with ciprofloxacin.The patient clearly was hyperthyroid. It appeared at that time that the most likely cause for her hyperthyroidism was that she had Hashitoxicosis. In this condition, the patient has flare ups of Hashimoto's disease. As a result of the thyroid inflammation,  preformed thyroid hormone that was in storage leaks out into the blood causing hyperthyroidism. She clearly had an elevated TPO antibody to fit that hypothesis. However, she also had a high-normal TSI level. It was possible that she had 2 different autoimmune processes going on, both Hashimoto's disease and Graves' disease. I decided to follow her clinically.  2. During the next several years, the patient had a very interesting course of autoimmune thyroid disease.   A. During the next year her TFTs fluctuated, but always remained on the hyperthyroid side of normal. By December of 2006 her TSH increased to 0.255, free T4 decreased  to 1.11, and her free T3 decreased to 3.4. At that point it appeared that her Hashimoto's disease was gradually destroying more thyroid cells and that she would likely be euthyroid within the next year.   B. Unfortunately, by 01/29/06 she became significantly more hyperthyroid again. She was chronically tired. Her energy was low. She was not sleeping well. She was having more hot  flashes. Her heart rate had increased to the low 100s. At times she was having dyspnea on exertion. She was having a lot of stomach upset and frequent stools. She was shaking a lot. She was emotionally up and down. She was having more trouble concentrating. She was sweating more. She was also noticing that she was losing proximal muscle strength in that it was now difficult to get up from a squatting position. These were all signs and symptoms of progressive Graves' disease. Her thyroid gland was 25 grams at the time. She had 2+ tremor of her hands. Her TSH was 0.006. Her free T4 was 1.90. Her free T3 was 7.3. At that point her TSI increased to 1.9. This was a 100-fold different reference range than what we had seen previously. According to this reference range any TSI value less than 1.3 was considered normal. At that point she had clear evidence for active Graves' disease. Since she definitely had both Hashimoto's disease and Graves' disease, it made sense to treat her with methimazole, which would control her Graves' disease until such time as Hashimoto's disease had destroyed enough thyroid cells so that she could no longer be thyrotoxic. I started her on methimazole, 20 mg per day.  C. On February 28, 2006, the patient suspected that she was having an adverse reaction to methimazole. In retrospect, she had taken 20 mg of methimazole per day from May 5th to May 24th. On or about May 24th she developed bilateral ankle swelling that was not painful. She stopped the methimazole then. By May 27, however, her right foot was progressively stiff and painful. On May 30 she had stiffness and pain in her left hand. She subsequently developed more stiffness and pain in her right shoulder and arms. She had trouble walking. She had gone on line and found a case report in the [place] [facility] in which a similar case of arthralgias occurred in a patient on methimazole. She saw Doctor Renold Genta, who treated her with  prednisone, giving her some relief.  She then saw Dr. Hurley Cisco, a rheumatologist who diagnosed a probable drug reaction. He continued the prednisone on a tapering regimen.   D. When I saw her next on 04/10/06, the pain and swelling was much diminished, but she still had some right wrist tenderness. Although she looked pretty well that day, I knew that the course of prednisone had likely reduced the conversion of T4 to T3, making her look better than she might be in terms of her lab values. Her TSH was 0.008. Free T4 had decreased to 1.55. Free T3 had decreased to 4.4. TSI was 1.4. We elected to watch her for another month to see how she would do. The patient decided to try some herbal supplements that had been recommended to her to see if they would control her Graves' disease.  E. Unfortunately, at the time of her next visit on 09/09/06 it was clear that she was more hyperthyroid. She was feeling somewhat weaker overall. Her energy was not too bad. Her sleep was not great. She was warm all the  time.  She was still very shaky. Her leg muscles were weaker. Her TSH was 0.008. Her free T4 was 2.38. Free T3 was 10.1. Her TSI was 1.2. We discussed the advantages and disadvantages of definitive therapy with either a thyroidectomy or radioactive iodine treatment. She stated she wanted to think on it.  F.  On  10/21/06, her TSH was 0.005. Her free T4 was 2.97. Her free T3 was up to 10.5. Her TPO at that point was even more elevated at 535.9. At that time she asked me for my recommendation for a surgeon for her. I recommended Dr. Darnell Levelodd Gerkin. She saw Dr. Gerrit FriendsGerkin at his office and they scheduled her surgery. On 02/02/07 she had a thyroidectomy. In late May she was becoming hypothyroid, so I started her on Synthroid 112 mcg per day. Over time, I gradually changed her Synthroid dose.  3. During the past twelve years, we have also been concerned about her osteopenia and her unintentional weigh loss.   A. At the time she  was recovering from her thyroid surgery we checked her calcium and bone metabolic studies. Her 25-hydroxy vitamin D was 40. Her 1, 25-dihydroxy vitamin D was 82. Her calcium was 9.4. Her PTH was 73.6, which was just slightly above the upper limit of normal of 72. It appeared at that point that she needed a higher calcium intake. Subsequent labs on 07/10/07 showed a 25-hydroxy vitamin D of 47, a 1,25 vitamin D of 82, a PTH of 31.9, and a calcium 10.1. During the last 5 years her PTH values have varied between 29.8 and 80.2. Her calcium values have varied between 9.0 and 10.0. In general, the better her calcium intake, the lower her parathyroid hormone levels have been.  B. In August of 2012, the first visit that I saw her after our health system's conversion to the [facility] electronic [facility] record system, her weight was 137 pounds. In June 2013 she weighed 132 pounds. In August 2014 she weighed 127 pounds. In February 2015 and again in November 2015 she weighed 124 pounds. In June 2016 her weight had decreased to 116 pounds and decreased further to 114 pounds in September. At her December 2016 visit her weight had increased to 115 pounds.   At that point we had not identified a definite cause of her unintentional weight loss. It was possible, however, that changing to a diet that was both gluten-free and lactose-free had resulted in a net decrease in calories. She has re-gained weight since then.  4. The patient's last [facility] visit was on 05/13/19. I continued her Synthroid regimen of 125 mcg/day for two days each week and 112 mcg/day for 5 days each week.   A. In the interim, she has been doing fairly well. Her father-in-law died recently after a long illness and her husband has been ill. Due to her allergies she has had more nasal congestion, but no sinusitis, so she has not had to take antibiotics recently.   B. The wet weather has made it difficult for her to practice her golf game.     C. She had not had any  ocular migraines for almost a year, but had two on February 6th 2021. Visual symptoms affected her left visual fields. The symptoms lasted about 30 minutes. She did not have a headache with the first set of visual symptoms, but did have a headache with the second set of visual symptoms. She could not identify an inciting event.   D.  Her vitreous detachment is stable. She had a follow up exam in March 2020 with Dr. Dagoberto Ligas. [name] has not had any further detachments or ocular migraines.   E. 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     F. [name] continues to take Synthroid, 125 mcg/day for 2 days each week and 112 mcg/day for 5 days each week. She has also been on topical estrogen (E3/E2), twice daily; progesterone SR, 50 mg each morning and 100 mg each evening. She is taking about 1250 mg of combined calcium carbonate and calcium citrate per day. She is also taking 2400 IU of vitamin D per day for 4 days per week and 4800 IU three days per week. She is also taking DHEA, 10 mg, daily to reduce her brain fog. She is also taking more vitamin K2. She has also been taking 3 different digestive enzymes. She had not been as stringent with her almost total gluten-free diet. She is no longer dairy-free. She still eats dark chocolate.    H. She is now has Medicare health insurance coverage.   5. Pertinent Review of Systems:  Constitutional: The patient has felt "pretty good". Her energy level is "good". She falls asleep very easily when she is not active. She gets about 6-7 hours of sleep per night. She still often tends to wake up before dawn. She does not take in much caffeine, none after 3 PM.    Face: She has not had much pressure/pain in the maxillary areas.    Eyes: As above. Vision is fairly good otherwise. Her eyes are still dry and she uses OTC eye drops as needed. There are no other significant eye complaints.  Ears: The  decreased hearing in her left ear is about the same.  Neck: Her neck still occasionally feels tight in the area of her thyroidectomy, which sometimes affects her swallowing big pills and peanuts. The patient has no other complaints of anterior neck swelling, soreness, tenderness,  pressure, discomfort, or difficulty swallowing.  Lungs: No recent problems.   Heart: Heart rate increases with exercise or other physical activity. The patient has no complaints of palpitations, irregular heat beats, chest pain, or chest pressure. Gastrointestinal: She has not had much reflux since she has been trying to eat dinner earlier.  Bowel movents seem normal most of the time. The patient has no complaints of excessive hunger, upset stomach, stomach aches or pains, diarrhea, or constipation. Arms and hands: The discomfort/pains in her right wrist has essentially resolved. The tendinitis in her left elbow has also essentially resolved.   Legs: Muscle mass and strength seem normal. Her right lateral calf remains relatively numb since her surgery. There are no other complaints of numbness, tingling, burning, or pain. No edema is noted. Feet: She notes continuing numbness and tingling in both feet, especially in her forefeet on both the plantar and dorsal surfaces. There are no other complaints of tingling or burning. No edema is noted. Neuro: She has not had any spider web-like sensations or shooting pains in different extremities at different times.  PAST MEDICAL, FAMILY, AND SOCIAL HISTORY:  Past Medical History:  Diagnosis Date  . Arthropathy   . Complication of anesthesia   . Grave's disease   . Hearing loss on left   . Hyperparathyroidism due to vitamin D deficiency (HCC)   . Hypertrophy, nasal, turbinate   . Hypocalcemia   . Hypothyroidism, acquired, autoimmune   .  Hypothyroidism, postop   . Meniere's disease of left ear   . Neuropathy, peripheral    bilateral feet  . Neuropathy, peripheral   .  Osteopenia   . Osteopenia   . PONV (postoperative nausea and vomiting)   . Thyrotoxicosis with diffuse goiter   . Vitamin D deficiency disease     Family History  Problem Relation Age of Onset  . Thyroid disease Mother   . Cancer Brother   . Hypertension Brother   . Obesity Brother   . Heart disease Father   . Heart disease Paternal Grandfather   . Diabetes Neg Hx   . Anemia Neg Hx   . Kidney disease Neg Hx      Current Outpatient Medications:  .  calcium carbonate (OS-CAL - DOSED IN MG OF ELEMENTAL CALCIUM) 1250 MG tablet, Take 1 tablet by mouth., Disp: , Rfl:  .  cholecalciferol (VITAMIN D3) 25 MCG (1000 UT) tablet, Take 1,000 Units by mouth daily., Disp: , Rfl:  .  Estradiol-Estriol-Progesterone (BIEST/PROGESTERONE) CREA, Place 0.5 mg onto the skin daily as needed., Disp: 60 g, Rfl: 0 .  IODINE EX, Apply topically., Disp: , Rfl:  .  IODINE-VITAMIN A PO, Take by mouth., Disp: , Rfl:  .  magnesium 30 MG tablet, Take 30 mg by mouth 2 (two) times daily., Disp: , Rfl:  .  Omega-3 Fatty Acids (OMEGA 3 PO), Take by mouth., Disp: , Rfl:  .  Progesterone Micronized (PROGESTERONE PO), Take 50 mg by mouth 1 day or 1 dose., Disp: , Rfl:  .  SYNTHROID 112 MCG tablet, TAKE 1 TABLET BY MOUTH EVERY DAY, Disp: 90 tablet, Rfl: 1 .  SYNTHROID 125 MCG tablet, TAKE 1 TABLET DAILY 5 DAYS PER WEEK, Disp: 65 tablet, Rfl: 3 .  zinc gluconate 50 MG tablet, Take 50 mg by mouth daily., Disp: , Rfl:  .  ALPHA LIPOIC ACID PO, Take by mouth., Disp: , Rfl:  .  levofloxacin (LEVAQUIN) 500 MG tablet, , Disp: , Rfl:  .  oxyCODONE-acetaminophen (PERCOCET) 5-325 MG tablet, Take 1 tablet by mouth every 4 (four) hours as needed for severe pain. (Patient not taking: Reported on 05/13/2019), Disp: 20 tablet, Rfl: 0  Allergies as of 11/15/2019 - Review Complete 11/15/2019  Allergen Reaction Noted  . Amoxicillin-pot clavulanate Nausea And Vomiting 02/21/2011  . Methimazole Other (See Comments) 02/21/2011    1.  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 2. Activities: Her golf circuit will begin when the weather improves. The schedule of meets is limited due to covid now.   3. Smoking, alcohol, or drugs: She occasionally drinks alcohol. She has never smoked or used drugs. 4. Primary Care Provider: None  REVIEW OF SYSTEMS: There are no other significant problems involving her other body systems.   Objective:  Vital Signs:  BP 100/64   Pulse 74   Ht 5' 7.32" (1.71 m)   Wt 120 lb 6.4 oz (54.6 kg)   BMI 18.68 kg/m    Wt Readings from Last 3 Encounters:  11/15/19 120 lb 6.4 oz (54.6 kg)  05/13/19 121 lb 12.8 oz (55.2 kg)  11/11/18 128 lb 3.2 oz (58.2 kg)    Ht Readings from Last 3 Encounters:  11/15/19 5' 7.32" (1.71 m)  10/19/18 5' 7.5" (1.715 m)  05/11/18 5' 7.91" (1.725 m)    HC Readings from Last 3 Encounters:  No data found for [facility]   Body mass index is 18.68 kg/m.  PHYSICAL EXAM:  Constitutional: The patient looks quite good today. She is bright, alert, upbeat, and very personable. Her color is good. Her weight has decreased 1 pound. She thinks that she has lost muscle mass due to not going to the gym regularly.    Head: The head is normocephalic. Face: The face appears normal.  Eyes: There is no obvious arcus or proptosis. Moisture appears normal.  Mouth: The oropharynx and tongue appear normal. Oral moisture is normal. Gingiva look normal.  Neck: The neck appears to be visibly normal. No carotid bruits are noted. The thyroid gland is absent. She still has very mild residual induration of her left strap muscle, but less over time.  Lungs: The lungs are clear to auscultation. Air movement is good. Heart: Heart rate and rhythm are regular. Heart sounds S1 and S2 are normal. I did not appreciate any pathologic cardiac murmurs. Abdomen: The abdomen is normal in size for the patient's age. Bowel sounds are normal. There is no obvious hepatomegaly,  splenomegaly, or other mass effect.  Arms: Muscle size and bulk are normal for age.  Hands: There is a trace tremor today. Phalangeal and metacarpophalangeal joints are normal. Palmar muscles are normal for age. Palmar skin is normal. Palmar moisture is normal. Legs: Muscles appear normal for age. No edema is present. Neurologic: Strength is normal for age in both the upper and lower extremities. Muscle tone is normal. Sensation to touch is essentially normal in her left leg, but slightly decreased in her right lateral leg.   LAB DATA:  Labs 11/08/19: TSH 0.58, free T4 1.6, free T3 3.5; CBC normal; CMP normal; PTH 39 (ref 14-64), calcium 9.6 (ref 8.6-10.4), 25-OH vitamin D 59; non-fasting lipid panel: cholesterol 207, triglycerides 139, HDL 75, LDL 107  Labs 05/05/19: TSH 0.55, free T4 1.5, free T3 3.1; PTH 40, calcium 9.4, 25-OH vitamin D 74  Labs 11/04/18: TSH 0.43, free T4 1.5, free T3 3.2; PTH 27, calcium 9.0, 25-OH vitamin D 48  Labs 05/04/18: TSH 2.08, free T4 1.4, free T3 2.6; PTH 36, calcium 9.9, 25-OH vitamin D 73  Labs 10/20/17: TSH 0.49, free T4 1.7, free T3 3.4  Labs 04/17/17: TSH 0.71, free T4 1.5, free T3 3.2; PTH 46, calcium 9.4, vitamin B6 196.1 (ref 2.1-21.7), vitamin B12 630 (ref [phone]), folate 10.4 (ref >5.5)  Labs 10/24/16: TSH 0.64, free T4 1.6, free T3 3.1; PTH 32, calcium 9.8; vitamin B6 264.3 (ref 2.1-21.7), vitamin B12 563 (ref [phone]), folate 9.9 (ref >5.4)  Labs 10/18/16: 25-OH vitamin D 57  Labs 08/20/16: CBC normal, CMP normal; cholesterol 221, triglycerides 57, HDL 87, LDL 120; 25-OH vitamin D 84; HbA1c 5.4%; AM cortisol 21.4, DHEAS 262 (ref 12-133 if not on DHEA); vitamin B12 511 (ref [phone]); vitamin a 63 (ref 38-98), testosterone 36, free testosterone 1.8 (ref 0.1-6.4); zinc 88 (60-130)  Labs 04/09/16: TSH  1.25, free T4 1.6, free T3 2.5; PTH 34, calcium 9.0, 25-OH vitamin D 73  Labs 01/18/16: TSH 0.92, free T4 1.7, free T3 3.0  Labs 08/21/15: TSH 1.406,  free T4 1.63, free T3 2.5; PTH 36, calcium 9.0; 25-OH vitamin D 61  Labs 03/21/15: Fructosamine 250 (normal 190-270)  Labs 03/17/15: HbA1c 5.4%; C-peptide 1.97 (0.80-3.90; 25-OH vitamin D 61; calcium 9.0, PTH 46; TSH 1.095, free T4 1.22, free T3 2.8  Labs 08/03/14: TSH 1.595, free T4 1.48, free T3 2.8; calcium 9.5, PTH 37, 25-hydroxy vitamin D 86, 1,25-dihydroxy vitamin D 50  Labs 11/15/13: TSH 2.345, free  T4 1.51, free T3 2.8; PTH 52.4, calcium 9.5, 25-hydroxy vitamin D 94; 1,25-dihydroxy vitamin D 92  Labs 05/01/13: TSH 6.151, free T4 1.45, free T3 3.0; CMP normal; cholesterol 211, triglycerides 87, HDL 73, LDL 121  Labs 01/26/13: TSH 2.72, free T4 1.39, free T3 2.5  Labs 09/21/12: TSH 5.308, free T4 0.90, free T3 2.5, PTH 53, calcium 9.2, 25-vitamin D 80, 1,25-vitamin D 64  Labs 02/25/12: TSH 3.443, free T4 1.29, free T3 2.7, calcium 9.5 PTH 46.8, 25-hydroxy vitamin D 87, 1,25-dihydroxy vitamin D 53, WBC 5.8, Hgb 13.0, Hct 38.3  Labs 04/25/11: 25 vitamin D was 82. 1,25 vitamin D was 50. PTH was 80.2.      Component Value Date/Time   WBC 8.0 11/08/2019 1543   HGB 13.3 11/08/2019 1543   HCT 40.3 11/08/2019 1543   PLT 248 11/08/2019 1543   CHOL 207 (H) 11/08/2019 1543   TRIG 139 11/08/2019 1543   HDL 75 11/08/2019 1543   ALT 14 11/08/2019 1543   AST 18 11/08/2019 1543   NA 139 11/08/2019 1543   K 4.9 11/08/2019 1543   CL 102 11/08/2019 1543   CREATININE 0.78 11/08/2019 1543   BUN 14 11/08/2019 1543   CO2 31 11/08/2019 1543   TSH 0.58 11/08/2019 1543   FREET4 1.6 11/08/2019 1543   T3FREE 3.5 11/08/2019 1543   HGBA1C 5.4 03/17/2015 1417   MICROALBUR 1.08 05/01/2013 0958   CALCIUM 9.6 11/08/2019 1543   CALCIUM 9.6 11/08/2019 1543   CALCIUM 9.2 08/10/2012 1049   PTH 39 11/08/2019 1543   IMAGING:  06/09/15: DEXA scan: Lumbar spine had a T-score of -2.6. There had been statistically significant interval decreased in bone mineral density at the following sites compared to exam of  09/17/2012: Left femur -4.4% and right femur -6.4%   Assessment and Plan:   ASSESSMENT:  1. Postsurgical hypothyroidism:   A. Since her thyroidectomy, we have made several changes in her Synthroid dosage over time.   B. At her prior visit her TSH was too low and she was still having some difficulties with early awakening, I suspected that her liver was not degrading thyroid hormones quite as well as it used to do. She needed a small reduction in her Synthroid dose, with the goal for her to have a TSH in the 1.0-2.0 range.  C. At today's visit her TFTS are again near the top end of the physiologically normal range. I suggested that we reduce her Synthroid dose slightly. She agrees.  2-4. Secondary hyperparathyroidism/hypocalcemia/vitamin d deficiency:  A. Since her thyroidectomy her calcium and PTH levels have also fluctuated over time. When she took in less calcium, her PTH levels increased. The converse has been true.   B. Her PTH, calcium, and vitamin D are all about mid-normal again in February 2021. I would like her to increase her calcium by another 20%.  5. Osteopenia/Osteoporosis: Patient's last bone mineral density was at [facility] on 05/22/15: Her lumbar spine T score was -2.6, c/w osteoporosis. There were statistically significant decreases in bone density of 4.4% in the left femur and 6.4% in the right femur. The recommendation from Dr. Rogelia Mire, MD at [facility] was to maintain adequate dietary intake of calcium and vitamin D and to continue weight-bearing exercise as tolerated. As noted above, her current intake of vitamin D is good and her intake/absorption of calcium is good. She will continue her excellent amount of weight-bearing exercise. We will discuss other options for treatment as  appropriate.  6. Unintentional weight loss: Resolved.   7. Visual field defect, peripheral and bilateral: As above.   8. Peripheral neuropathy, hereditary/idiopathic: She thinks that she  first noticed this problem prior to her thyroid surgery. Her B12, B6, and folate levels have been very normal twice in a row.   PLAN:  1. Diagnostic: Reviewed her recent lab results. Repeat CMP, CBC, TFTs, calcium PTH, and 25-OH vitamin D in 6 months.  2. Therapeutic: Change her Synthroid regimen to one 125 mcg tablet/day for 1 day per week and one 112 mcg tablet/day for 6 days per week. Adjust Synthroid and calcium doses as needed.  Maintain her vitamin D intake, but try to increase the calcium intake by 20%.  3. Patient education: We discussed all of the above at great length. Try to get back to her usual exercise regimen as soon as possible. 4. Follow-up: 6 months   Level of Service: This visit lasted in excess of 50 minutes. More than 50% of the visit was devoted to counseling.  Molli Knock, MD, CDE Adult and Pediatric Endocrinology

## 2019-12-07 ENCOUNTER — Other Ambulatory Visit: Payer: Self-pay | Admitting: "Endocrinology

## 2019-12-15 ENCOUNTER — Other Ambulatory Visit: Payer: Self-pay

## 2019-12-15 ENCOUNTER — Ambulatory Visit
Admission: RE | Admit: 2019-12-15 | Discharge: 2019-12-15 | Disposition: A | Discharge status: Home / Self Care | Payer: Medicare Other | Source: Ambulatory Visit | Attending: Otolaryngology | Admitting: Otolaryngology

## 2019-12-15 ENCOUNTER — Other Ambulatory Visit: Payer: Self-pay | Admitting: Otolaryngology

## 2019-12-15 DIAGNOSIS — R0789 Other chest pain: Secondary | ICD-10-CM

## 2019-12-15 DIAGNOSIS — R0602 Shortness of breath: Secondary | ICD-10-CM

## 2020-01-11 ENCOUNTER — Other Ambulatory Visit: Payer: Self-pay | Admitting: "Endocrinology

## 2020-01-11 DIAGNOSIS — R634 Abnormal weight loss: Secondary | ICD-10-CM

## 2020-02-12 IMAGING — CT CT MAXILLOFACIAL W/O CM
3 of 4 series · 14 of 47 positions shown, 16 images · non-contrast
Comparison: 08/29/2016

CLINICAL DATA: Chronic sinusitis with congestion and pressure.

EXAM:
CT MAXILLOFACIAL WITHOUT CONTRAST
TECHNIQUE: Multidetector CT images of the paranasal sinuses were obtained using
the standard protocol without intravenous contrast.

[Series 2: sinus 2.00 hr60 s3 ax · axial · 0.35mm/px · z∈[-647,-547]mm · 8 of 60 slices shown, 10 images]
[im 5/60  brain]
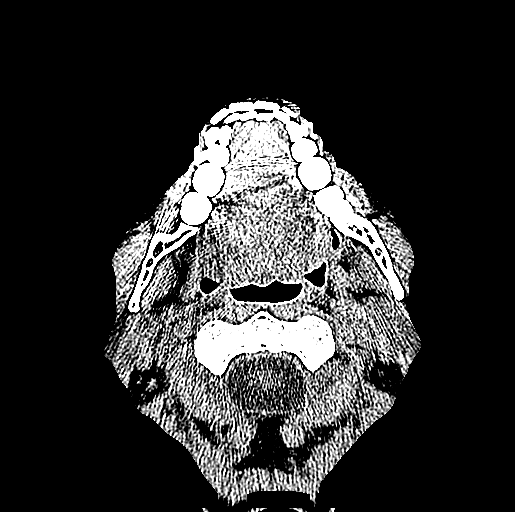
[im 5/60  bone]
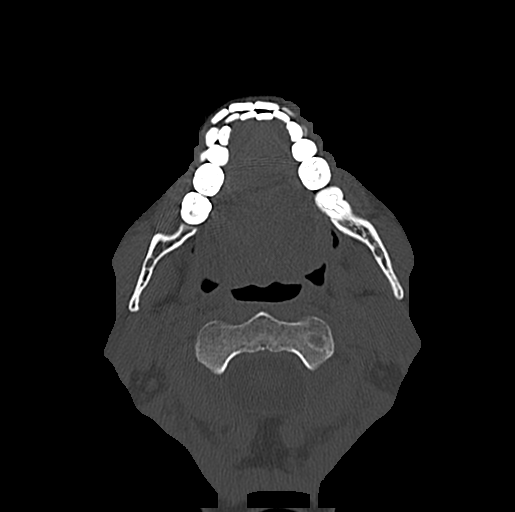
[im 13/60  bone]
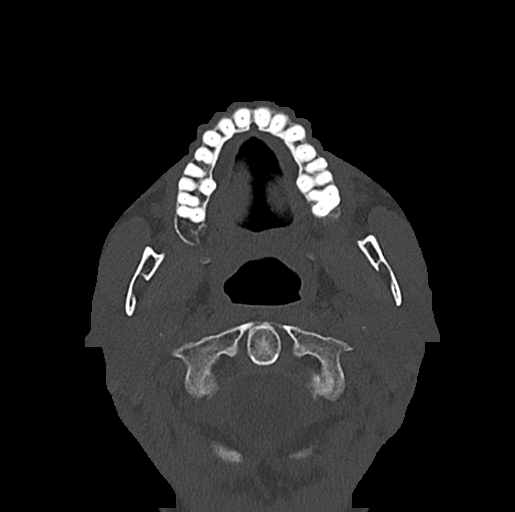
[im 22/60  bone]
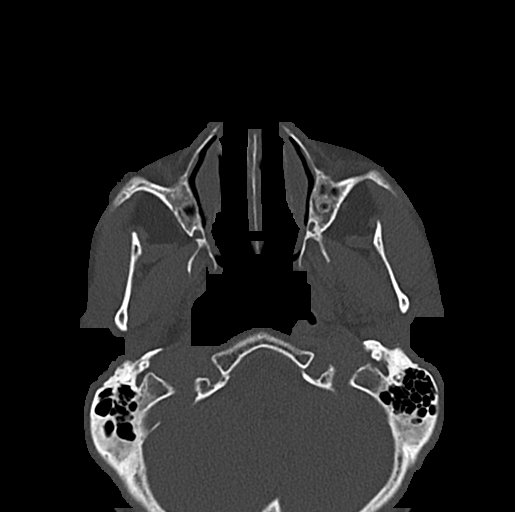
[im 26/60  bone]
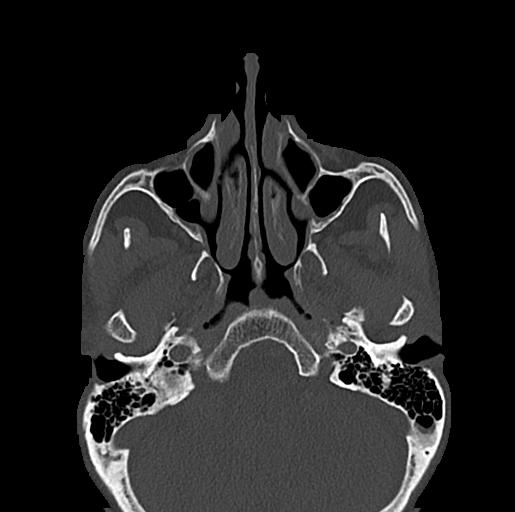
[im 34/60  brain]
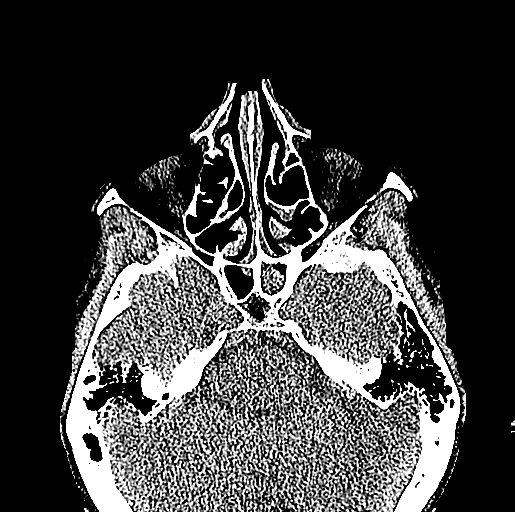
[im 34/60  bone]
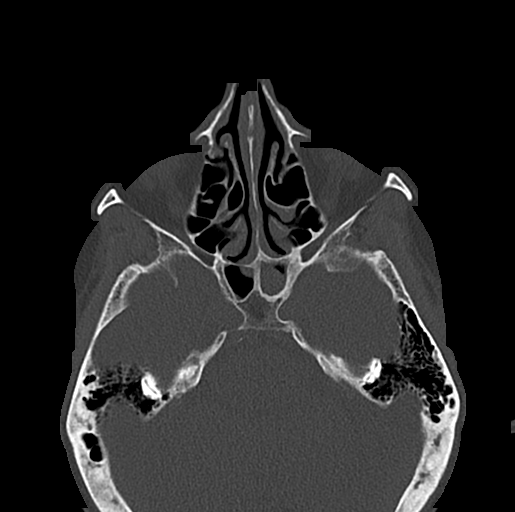
[im 38/60  bone]
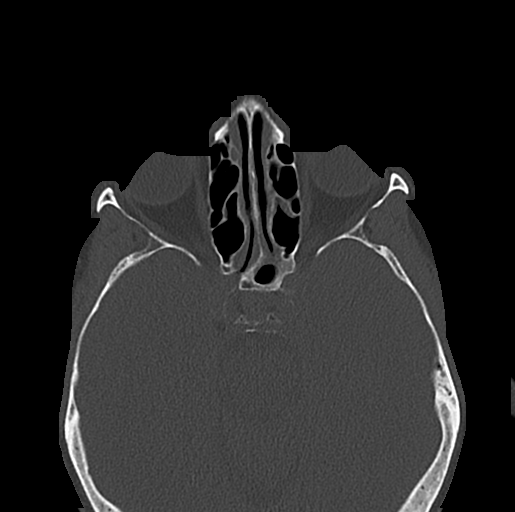
[im 47/60  bone]
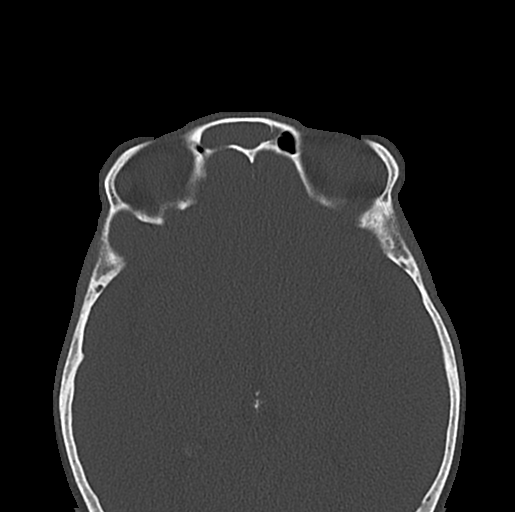
[im 55/60  bone]
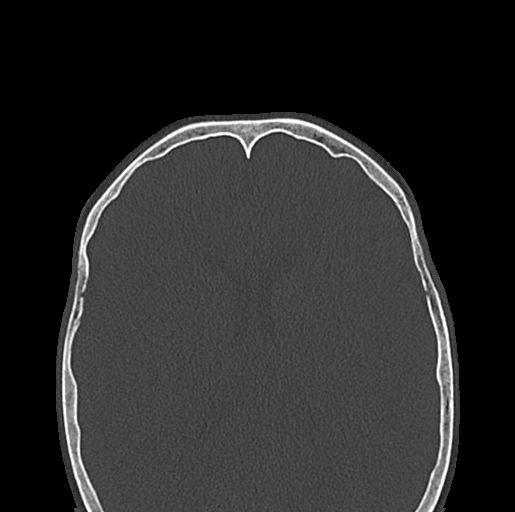

[Series 4: sinus 2.00 hr60 s3 cor · coronal · 0.24mm/px · 3 of 90 slices shown]
[im 30/90  bone]
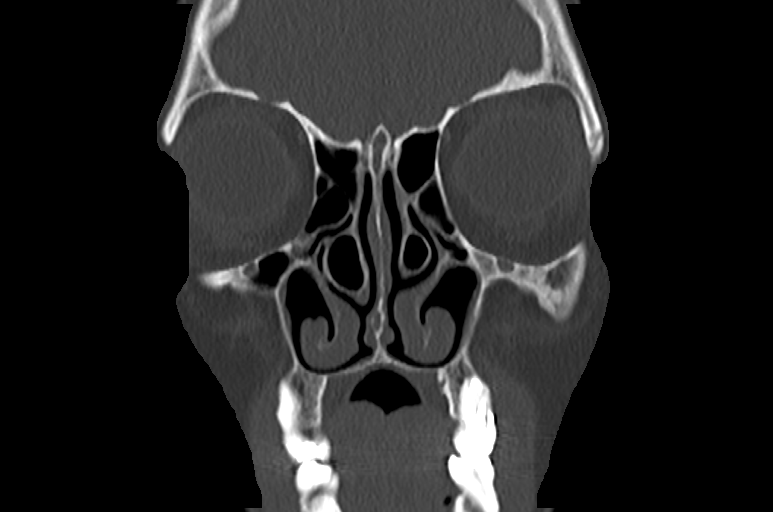
[im 40/90  bone]
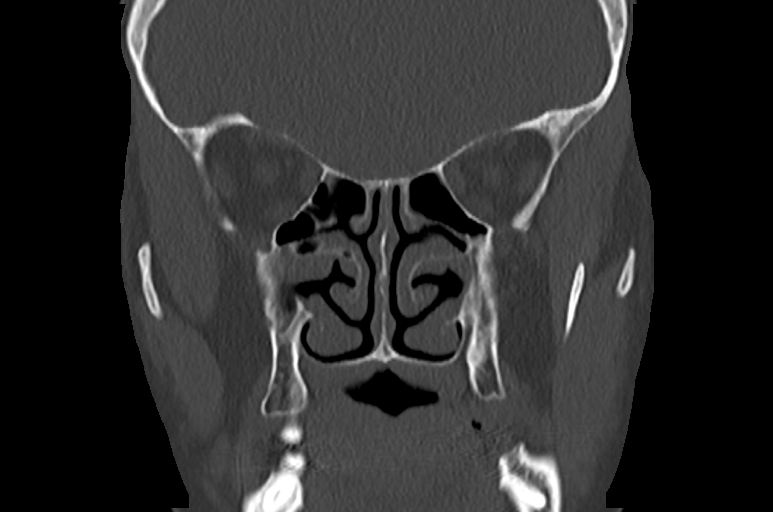
[im 50/90  bone]
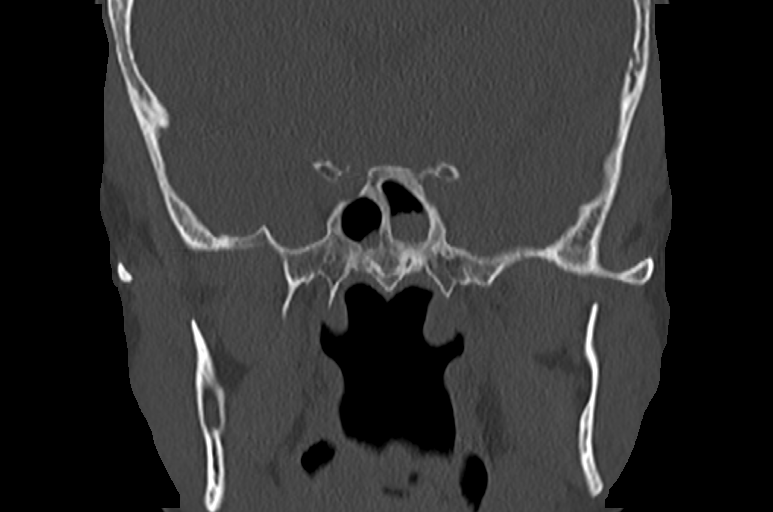

[Series 6: sinus 2.00 hr60 s3 sag · sagittal · 0.24mm/px · 3 of 91 slices shown]
[im 31/91  bone]
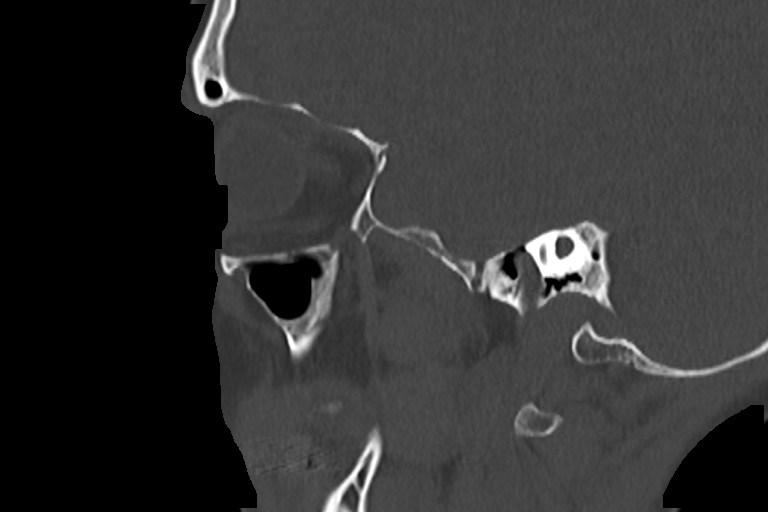
[im 46/91  bone]
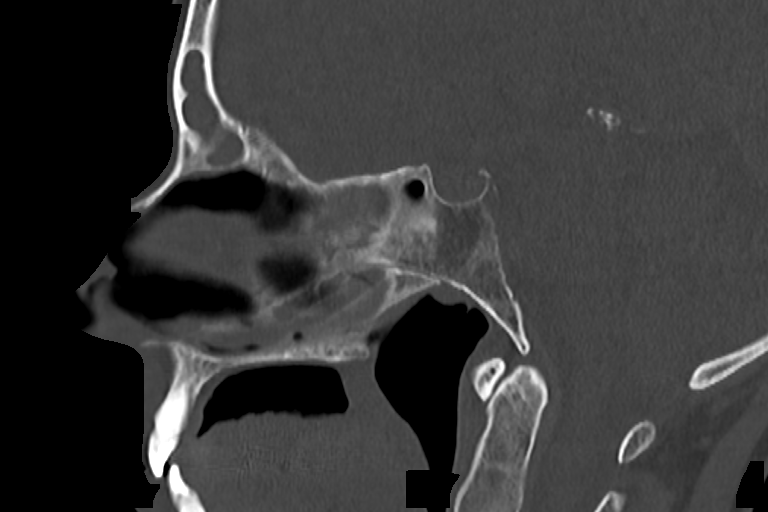
[im 61/91  bone]
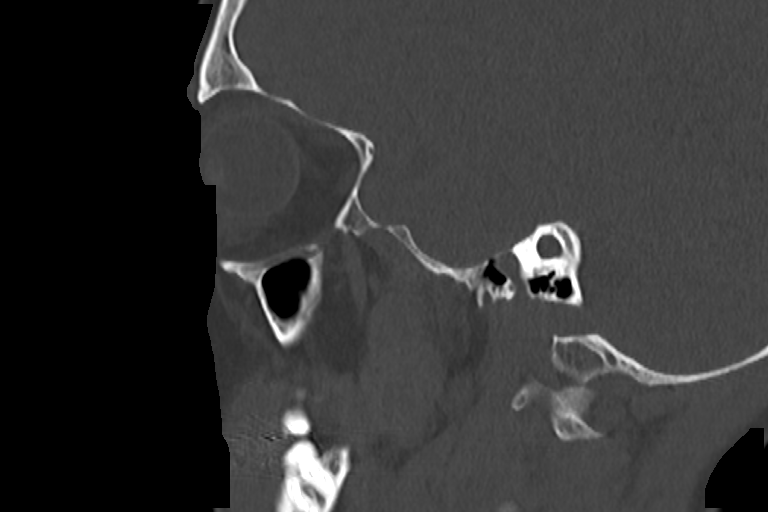

[14 of 47 positions shown; findings below may reference images not displayed]

FINDINGS: Paranasal sinuses:

Frontal: Large central division of the frontal sinus shows chronic
opacification with mucoperiosteal thickening. Small bilateral
lateral compartments are clear. Thinning of the posterior wall of
the affected sinus posteriorly. This is of some concern and could
lead to intracranial communication. This could also be relevant at
surgery.

Ethmoid: Mild ethmoid mucosal thickening. No opacified ethmoid air
cells.

Maxillary: Diminutive maxillary sinuses. Mild mucosal thickening
left more than right.

Sphenoid: Diminutive sphenoid sinus. Mucosal thickening and chronic
mucoperiosteal thickening left more than right.

Right ostiomeatal unit: Infundibulum sufficiently patent. Adjacent
Haller cell. Concha bullosa of the middle turbinate on the right.
Accessory ostium.

Left ostiomeatal unit: Infundibulum sufficiently patent. Mild
narrowing by mucosal thickening. Concha bullosa of the middle
turbinate on the left. Accessory ostium.

Nasal passages: Patent. Nasal septum is midline. Concha bullosa of
the middle turbinates as noted above.

Anatomy: No pneumatization superior to anterior ethmoid notches.
Symmetric and intact olfactory grooves and fovea ethmoidalis, Keros
II (4-7mm) Presellar sphenoid pneumatization pattern. No dehiscence
of carotid or optic canals. No onodi cell.

Other: None
IMPRESSION: Congenitally diminutive maxillary and sphenoid sinuses. Chronic
mucoperiosteal thickening of the sphenoid sinus, more on the left
than the right. Mild mucosal thickening of the maxillary sinuses.
Sufficient patency of the infundibular LEEP. Bilateral accessory
ostia.

Opacified large central compartment of the frontal sinuses with
normally aerated lateral compartments. Chronic mucoperiosteal
changes. Thinning of the posterior wall particularly on the right
which could place the patient at risk of intracranial extension of
disease and could also be relevant at surgery.

## 2020-03-03 ENCOUNTER — Other Ambulatory Visit: Payer: Self-pay | Admitting: "Endocrinology

## 2020-05-13 LAB — VITAMIN D 1,25 DIHYDROXY
Vitamin D 1, 25 (OH)2 Total: 44 pg/mL (ref 18–72)
Vitamin D2 1, 25 (OH)2: <8 pg/mL
Vitamin D3 1, 25 (OH)2: 44 pg/mL

## 2020-05-13 LAB — PTH, INTACT AND CALCIUM
Calcium: 9.7 mg/dL (ref 8.6–10.4)
PTH: 57 pg/mL (ref 14–64)

## 2020-05-13 LAB — T3, FREE: T3, Free: 3.2 pg/mL (ref 2.3–4.2)

## 2020-05-13 LAB — TSH: TSH: 1.16 mIU/L (ref 0.40–4.50)

## 2020-05-13 LAB — T4, FREE: Free T4: 1.5 ng/dL (ref 0.8–1.8)

## 2020-05-18 ENCOUNTER — Other Ambulatory Visit: Payer: Self-pay

## 2020-05-18 ENCOUNTER — Ambulatory Visit (INDEPENDENT_AMBULATORY_CARE_PROVIDER_SITE_OTHER): Payer: Medicare Other | Admitting: "Endocrinology

## 2020-05-18 ENCOUNTER — Encounter (INDEPENDENT_AMBULATORY_CARE_PROVIDER_SITE_OTHER): Payer: Self-pay | Admitting: "Endocrinology

## 2020-05-18 VITALS — BP 112/72 | HR 68 | Wt 121.1 lb

## 2020-05-18 DIAGNOSIS — G588 Other specified mononeuropathies: Secondary | ICD-10-CM | POA: Diagnosis not present

## 2020-05-18 DIAGNOSIS — E890 Postprocedural hypothyroidism: Secondary | ICD-10-CM | POA: Diagnosis not present

## 2020-05-18 DIAGNOSIS — M8580 Other specified disorders of bone density and structure, unspecified site: Secondary | ICD-10-CM

## 2020-05-18 DIAGNOSIS — N2581 Secondary hyperparathyroidism of renal origin: Secondary | ICD-10-CM

## 2020-05-18 DIAGNOSIS — E559 Vitamin D deficiency, unspecified: Secondary | ICD-10-CM

## 2020-05-18 NOTE — Progress Notes (Signed)
Subjective:  Patient Name: Kristina Rios Date of Birth: [birth year]  MRN: [id number]  [name]  presents to the office today for follow-up of her post-surgical hypothyroidism, osteopenia, secondary hyperparathyroidism, vitamin D deficiency, hypocalcemia, arthropathy, and neuropathy of her right leg and foot secondary to a right ACL repair.  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 Caucasian woman.  [name] was unaccompanied.  1. [name] was first referred to me on 02/21/05 by her former primary care provider, Dr. Tommie Ard Baxley, for low thyroid stimulating hormone level. The patient was 69 years old.  A. Lab data on 09/22/03 showed a TSH of 2.241. However, lab data on 10/12/04 showed a TSH of 0.043 and a free T4 of 1.26. Follow up lab tests on 11/3004 showed a TSH of 0.004 and a free T4 of 1.48. The TPO antibody was 297.3, consistent with Hashimoto's disease.  B. When she had her first visit with me, she had had a recent sinus infection and URI and did not feel well. She had some problems with insomnia and early awakening. She woke up hot every morning, but that had not changed in 20 years. She noticed an occasional irregular heart beat. She also noticed herself feeling somewhat jittery and shaky over the past year. She was also feeling anxious at times. Her periods were regular. Past medical history was positive for a diagnosis of osteopenia made 2 years previously. She had Mnire's disease. She also had seasonal allergies. Surgical history was prominent for 2 knee surgeries, tonsils, and adenoids. 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 Her family history was positive for hypothyroidism and osteopenia in her mother. Her father and paternal grandfather had heart disease.  C. On physical examination, her weight was 129.6 pounds at a height of 5 feet 7-1/2 inches. Her BMI  was 19.9. Her blood pressure was 124/80. Her heart rate was 75. She looked like the slender and fit athlete that she was. She was alert and oriented to person place and time. Her affect and insight were fine. She had no acute distress. She had a tender left maxillary sinus. She had a 20+ gram thyroid gland. The thyroid gland was mildly, but diffusely enlarged. Thyroid gland was nontender. She had 1+ tremor of her hands. She had 2+ palmar moisture. Laboratory data showed a TSH of 0.022. Her free T4 was 1.26. Her free T3 was 3.9. Her TSI level was 107 (normal 0-129).  D. The patient had an active left maxillary sinusitis, which I treated with ciprofloxacin.The patient clearly was hyperthyroid. It appeared at that time that the most likely cause for her hyperthyroidism was that she had Hashitoxicosis. In this condition, the patient has flare ups of Hashimoto's disease. As a result of the thyroid inflammation,  preformed thyroid hormone that was in storage leaks out into the blood causing hyperthyroidism. She clearly had an elevated TPO antibody to fit that hypothesis. However, she also had a high-normal TSI level. It was possible that she had 2 different autoimmune processes going on, both Hashimoto's disease and Graves' disease. I decided to follow her clinically.  2. During the next several years, the patient had a very interesting course of autoimmune thyroid disease.   A. During the next year her TFTs fluctuated, but always remained on the hyperthyroid side of normal. By December of 2006 her TSH increased to 0.255, free T4 decreased  to 1.11, and her free T3 decreased to 3.4. At that point it appeared that her Hashimoto's disease was gradually destroying more thyroid cells and that she would likely be euthyroid within the next year.   B. Unfortunately, by 01/29/06 she became significantly more hyperthyroid again. She was chronically tired. Her energy was low. She was not sleeping well. She was having more hot  flashes. Her heart rate had increased to the low 100s. At times she was having dyspnea on exertion. She was having a lot of stomach upset and frequent stools. She was shaking a lot. She was emotionally up and down. She was having more trouble concentrating. She was sweating more. She was also noticing that she was losing proximal muscle strength in that it was now difficult to get up from a squatting position. These were all signs and symptoms of progressive Graves' disease. Her thyroid gland was 25 grams at the time. She had 2+ tremor of her hands. Her TSH was 0.006. Her free T4 was 1.90. Her free T3 was 7.3. At that point her TSI increased to 1.9. This was a 100-fold different reference range than what we had seen previously. According to this reference range any TSI value less than 1.3 was considered normal. At that point she had clear evidence for active Graves' disease. Since she definitely had both Hashimoto's disease and Graves' disease, it made sense to treat her with methimazole, which would control her Graves' disease until such time as Hashimoto's disease had destroyed enough thyroid cells so that she could no longer be thyrotoxic. I started her on methimazole, 20 mg per day.  C. On February 28, 2006, the patient suspected that she was having an adverse reaction to methimazole. In retrospect, she had taken 20 mg of methimazole per day from May 5th to May 24th. On or about May 24th she developed bilateral ankle swelling that was not painful. She stopped the methimazole then. By May 27, however, her right foot was progressively stiff and painful. On May 30 she had stiffness and pain in her left hand. She subsequently developed more stiffness and pain in her right shoulder and arms. She had trouble walking. She had gone on line and found a case report in the [place] [facility] in which a similar case of arthralgias occurred in a patient on methimazole. She saw Doctor Renold Genta, who treated her with  prednisone, giving her some relief.  She then saw Dr. Hurley Cisco, a rheumatologist who diagnosed a probable drug reaction. He continued the prednisone on a tapering regimen.   D. When I saw her next on 04/10/06, the pain and swelling was much diminished, but she still had some right wrist tenderness. Although she looked pretty well that day, I knew that the course of prednisone had likely reduced the conversion of T4 to T3, making her look better than she might be in terms of her lab values. Her TSH was 0.008. Free T4 had decreased to 1.55. Free T3 had decreased to 4.4. TSI was 1.4. We elected to watch her for another month to see how she would do. The patient decided to try some herbal supplements that had been recommended to her to see if they would control her Graves' disease.  E. Unfortunately, at the time of her next visit on 09/09/06 it was clear that she was more hyperthyroid. She was feeling somewhat weaker overall. Her energy was not too bad. Her sleep was not great. She was warm all the  time.  She was still very shaky. Her leg muscles were weaker. Her TSH was 0.008. Her free T4 was 2.38. Free T3 was 10.1. Her TSI was 1.2. We discussed the advantages and disadvantages of definitive therapy with either a thyroidectomy or radioactive iodine treatment. She stated she wanted to think on it.  F.  On  10/21/06, her TSH was 0.005. Her free T4 was 2.97. Her free T3 was up to 10.5. Her TPO at that point was even more elevated at 535.9. At that time she asked me for my recommendation for a surgeon for her. I recommended Dr. Darnell Level. She saw Dr. Gerrit Friends at his office and they scheduled her surgery. On 02/02/07 she had a thyroidectomy. In late May she was becoming hypothyroid, so I started her on Synthroid 112 mcg per day. Over time, I gradually changed her Synthroid dose.  3. During the past thirteen years, we have also been concerned about her osteopenia and her unintentional weigh loss.   A. At the time  she was recovering from her thyroid surgery we checked her calcium and bone metabolic studies. Her 25-hydroxy vitamin D was 40. Her 1, 25-dihydroxy vitamin D was 82. Her calcium was 9.4. Her PTH was 73.6, which was just slightly above the upper limit of normal of 72. It appeared at that point that she needed a higher calcium intake. Subsequent labs on 07/10/07 showed a 25-hydroxy vitamin D of 47, a 1,25 vitamin D of 82, a PTH of 31.9, and a calcium 10.1. During the last 5 years her PTH values have varied between 29.8 and 80.2. Her calcium values have varied between 9.0 and 10.0. In general, the better her calcium intake, the lower her parathyroid hormone levels have been.  B. In August of 2012, the first visit that I saw her after our health system's conversion to the [facility] electronic medical record system, her weight was 137 pounds. In June 2013 she weighed 132 pounds. In August 2014 she weighed 127 pounds. In February 2015 and again in November 2015 she weighed 124 pounds. In June 2016 her weight had decreased to 116 pounds and decreased further to 114 pounds in September. At her December 2016 visit her weight had increased to 115 pounds.   At that point we had not identified a definite cause of her unintentional weight loss. It was possible, however, that changing to a diet that was both gluten-free and lactose-free had resulted in a net decrease in calories. She has re-gained weight since then.  4. The patient's last [facility] visit was on 11/15/19. I changed her Synthroid regimen to 125 mcg/day for one day each week and 112 mcg/day for 6 days each week. She takes her Synthroid in the evening.   A. In the interim, she has been doing fairly well. Her allergies have "not been too bad". Her sinuses have been doing well. She has not had to take antibiotics recently.   B. She had to take her husband to the ED last night for the second time for A-fib and hypertension.       C. She had had "a bunch" of ocular migraines  recently. She sees Dr. Larina Bras for this problem. Visual symptoms affected her left visual fields. The symptoms lasted about 30-45 minutes. She does not usually have headache.   D.  Her vitreous detachment is stable. She had a follow up exam in about February 2021 with Dr. Dagoberto Ligas. [name] has not had any further detachments or ocular migraines.  E. 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     F. [name] continues to take Synthroid, 125 mcg/day for 1 day each week and 112 mcg/day for 6 days each week. She has also been on topical estrogen (E3/E2), twice daily; progesterone SR, 50 mg each morning and 100 mg each evening. She is taking about 1250 mg of combined calcium carbonate and calcium citrate per day. She is also taking 2400 IU of vitamin D per day for 4 days per week and 4800 IU three days per week. She is also taking DHEA, 10 mg, daily to reduce her brain fog. She is also taking more vitamin K2. She has also been taking 3 different digestive enzymes. She had been fairly stringent with her almost total gluten-free diet. She is no longer dairy-free. She still eats dark chocolate.    H. She is now has Medicare health insurance coverage.   5. Pertinent Review of Systems:  Constitutional: The patient has felt "pretty good". Her energy level is "good". She falls asleep very easily when she is not active. She gets about 6-7 hours of sleep per night. She still often tends to wake up before dawn. She does not take in much caffeine, none after 3 PM.    Face: She has not had much pressure/pain in the maxillary areas.    Eyes: As above. Vision is fairly good otherwise. Her eyes are still dry and she uses OTC eye drops as needed. There are no other significant eye complaints.  Ears: The decreased hearing in her left ear is about the same.  Neck: Her neck still occasionally feels tight in the area of her thyroidectomy, which sometimes  affects her swallowing big pills and peanuts. The patient has no other complaints of anterior neck swelling, soreness, tenderness,  pressure, discomfort, or difficulty swallowing.  Lungs: No recent problems.   Heart: She has noted more palpitations at times, although less frequently recently. Heart rate increases with exercise or other physical activity. The patient has no complaints of palpitations, irregular heat beats, chest pain, or chest pressure. Gastrointestinal: She has not had much reflux since she has been trying to eat dinner earlier. Bowel movents seem normal most of the time. The patient has no complaints of excessive hunger, upset stomach, stomach aches or pains, diarrhea, or constipation. Arms and hands: The discomfort/pains in her right wrist have essentially resolved. The tendinitis in her left elbow has also essentially resolved.   Legs: Muscle mass and strength seem normal. Her right lateral calf remains relatively numb since her surgery. There are no other complaints of numbness, tingling, burning, or pain. No edema is noted. Feet: She notes continuing numbness and tingling in both feet, especially in her forefeet on both the plantar and dorsal surfaces. There are no other complaints of tingling or burning. No edema is noted. Neuro: She occasionally has had some spider web-like sensations or shooting pains in different extremities at different times.  PAST MEDICAL, FAMILY, AND SOCIAL HISTORY:  Past Medical History:  Diagnosis Date  . Arthropathy   . Complication of anesthesia   . Grave's disease   . Hearing loss on left   . Hyperparathyroidism due to vitamin D deficiency (HCC)   . Hypertrophy, nasal, turbinate   . Hypocalcemia   . Hypothyroidism, acquired, autoimmune   . Hypothyroidism, postop   . Meniere's disease of left ear   . Neuropathy, peripheral    bilateral  feet  . Neuropathy, peripheral   . Osteopenia   . Osteopenia   . PONV (postoperative nausea and  vomiting)   . Thyrotoxicosis with diffuse goiter   . Vitamin D deficiency disease     Family History  Problem Relation Age of Onset  . Thyroid disease Mother   . Cancer Brother   . Hypertension Brother   . Obesity Brother   . Heart disease Father   . Heart disease Paternal Grandfather   . Diabetes Neg Hx   . Anemia Neg Hx   . Kidney disease Neg Hx      Current Outpatient Medications:  .  calcium carbonate (OS-CAL - DOSED IN MG OF ELEMENTAL CALCIUM) 1250 MG tablet, Take 1 tablet by mouth., Disp: , Rfl:  .  cholecalciferol (VITAMIN D3) 25 MCG (1000 UT) tablet, Take 1,000 Units by mouth daily., Disp: , Rfl:  .  Estradiol-Estriol-Progesterone (BIEST/PROGESTERONE) CREA, Take 1 capsule in the morning and 2 capsules at bedtime., Disp: 270 g, Rfl: PRN .  magnesium 30 MG tablet, Take 30 mg by mouth 2 (two) times daily., Disp: , Rfl:  .  MELATONIN ER PO, Take 6 mg by mouth., Disp: , Rfl:  .  Omega-3 Fatty Acids (OMEGA 3 PO), Take by mouth., Disp: , Rfl:  .  Progesterone Micronized (PROGESTERONE PO), Take 50 mg by mouth 1 day or 1 dose., Disp: , Rfl:  .  SYNTHROID 112 MCG tablet, TAKE 1 TABLET BY MOUTH EVERY DAY, Disp: 90 tablet, Rfl: 1 .  SYNTHROID 125 MCG tablet, Take 1 tablet 1 day a week, Disp: 15 tablet, Rfl: 1 .  zinc gluconate 50 MG tablet, Take 50 mg by mouth daily., Disp: , Rfl:  .  ALPHA LIPOIC ACID PO, Take by mouth. (Patient not taking: Reported on 05/18/2020), Disp: , Rfl:  .  IODINE EX, Apply topically. (Patient not taking: Reported on 05/18/2020), Disp: , Rfl:  .  IODINE-VITAMIN A PO, Take by mouth. (Patient not taking: Reported on 05/18/2020), Disp: , Rfl:  .  levofloxacin (LEVAQUIN) 500 MG tablet, , Disp: , Rfl:  .  oxyCODONE-acetaminophen (PERCOCET) 5-325 MG tablet, Take 1 tablet by mouth every 4 (four) hours as needed for severe pain. (Patient not taking: Reported on 05/13/2019), Disp: 20 tablet, Rfl: 0  Allergies as of 05/18/2020 - Review Complete 05/18/2020  Allergen  Reaction Noted  . Amoxicillin-pot clavulanate Nausea And Vomiting 02/21/2011  . Methimazole Other (See Comments) 02/21/2011    1. 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 2. Activities: Her golf tournament circuit is ongoing. She has limited her schedule of meets to only local meets, due to her not wanting to travel far during the covid pandemic.   3. Smoking, alcohol, or drugs: She occasionally drinks alcohol. She has never smoked or used drugs. 4. Primary Care Provider: None  REVIEW OF SYSTEMS: There are no other significant problems involving her other body systems.   Objective:  Vital Signs:  BP 112/72   Pulse 68   Wt 121 lb 1.6 oz (54.9 kg)   BMI 18.79 kg/m    Wt Readings from Last 3 Encounters:  05/18/20 121 lb 1.6 oz (54.9 kg)  11/15/19 120 lb 6.4 oz (54.6 kg)  05/13/19 121 lb 12.8 oz (55.2 kg)    Ht Readings from Last 3 Encounters:  11/15/19 5' 7.32" (1.71 m)  10/19/18 5' 7.5" (1.715 m)  05/11/18 5' 7.91" (1.725 m)    HC Readings from Last  3 Encounters:  No data found for [facility]   Body mass index is 18.79 kg/m.  PHYSICAL EXAM:  Constitutional: The patient looks quite good today. She is bright, alert, upbeat, and very personable. Her color is good. Her weight has increased 11.2 ounces.  Head: The head is normocephalic. Face: The face appears normal.  Eyes: There is no obvious arcus or proptosis. Moisture appears normal.  Mouth: The oropharynx and tongue appear normal. Oral moisture is normal. Gingiva look normal.  Neck: The neck appears to be visibly normal. No carotid bruits are noted. The thyroid gland is absent. She still has very mild residual induration of her left strap muscle, but much less over time.  Lungs: The lungs are clear to auscultation. Air movement is good. Heart: Heart rate and rhythm are regular. Heart sounds S1 and S2 are normal. I did not appreciate any pathologic cardiac murmurs. Abdomen: The abdomen is normal in  size for the patient's age. Bowel sounds are normal. There is no obvious hepatomegaly, splenomegaly, or other mass effect.  Arms: Muscle size and bulk are normal for age.  Hands: There is a trace tremor today. Phalangeal and metacarpophalangeal joints are normal. Palmar muscles are normal for age. Palmar skin is normal. Palmar moisture is normal. Legs: Muscles appear normal for age. No edema is present. Neurologic: Strength is normal for age in both the upper and lower extremities. Muscle tone is normal. Sensation to touch is essentially normal in her left leg, but slightly decreased in her right lateral leg.   LAB DATA:  Labs 05/10/20: TSH 1.16, free T4 1.6, free T3 3.2; PTH 57, calcium 9.7, calcitriol 44 (ref 18-72)  Labs 11/08/19: TSH 0.58, free T4 1.6, free T3 3.5; CBC normal; CMP normal; PTH 39 (ref 14-64), calcium 9.6 (ref 8.6-10.4), 25-OH vitamin D 59; non-fasting lipid panel: cholesterol 207, triglycerides 139, HDL 75, LDL 107  Labs 05/05/19: TSH 0.55, free T4 1.5, free T3 3.1; PTH 40, calcium 9.4, 25-OH vitamin D 74  Labs 11/04/18: TSH 0.43, free T4 1.5, free T3 3.2; PTH 27, calcium 9.0, 25-OH vitamin D 48  Labs 05/04/18: TSH 2.08, free T4 1.4, free T3 2.6; PTH 36, calcium 9.9, 25-OH vitamin D 73  Labs 10/20/17: TSH 0.49, free T4 1.7, free T3 3.4  Labs 04/17/17: TSH 0.71, free T4 1.5, free T3 3.2; PTH 46, calcium 9.4, vitamin B6 196.1 (ref 2.1-21.7), vitamin B12 630 (ref [phone]), folate 10.4 (ref >5.5)  Labs 10/24/16: TSH 0.64, free T4 1.6, free T3 3.1; PTH 32, calcium 9.8; vitamin B6 264.3 (ref 2.1-21.7), vitamin B12 563 (ref [phone]), folate 9.9 (ref >5.4)  Labs 10/18/16: 25-OH vitamin D 57  Labs 08/20/16: CBC normal, CMP normal; cholesterol 221, triglycerides 57, HDL 87, LDL 120; 25-OH vitamin D 84; HbA1c 5.4%; AM cortisol 21.4, DHEAS 262 (ref 12-133 if not on DHEA); vitamin B12 511 (ref [phone]); vitamin a 63 (ref 38-98), testosterone 36, free testosterone 1.8 (ref 0.1-6.4); zinc 88  (60-130)  Labs 04/09/16: TSH  1.25, free T4 1.6, free T3 2.5; PTH 34, calcium 9.0, 25-OH vitamin D 73  Labs 01/18/16: TSH 0.92, free T4 1.7, free T3 3.0  Labs 08/21/15: TSH 1.406, free T4 1.63, free T3 2.5; PTH 36, calcium 9.0; 25-OH vitamin D 61  Labs 03/21/15: Fructosamine 250 (normal 190-270)  Labs 03/17/15: HbA1c 5.4%; C-peptide 1.97 (0.80-3.90; 25-OH vitamin D 61; calcium 9.0, PTH 46; TSH 1.095, free T4 1.22, free T3 2.8  Labs 08/03/14: TSH 1.595, free T4 1.48, free T3  2.8; calcium 9.5, PTH 37, 25-hydroxy vitamin D 86, 1,25-dihydroxy vitamin D 50  Labs 11/15/13: TSH 2.345, free T4 1.51, free T3 2.8; PTH 52.4, calcium 9.5, 25-hydroxy vitamin D 94; 1,25-dihydroxy vitamin D 92  Labs 05/01/13: TSH 6.151, free T4 1.45, free T3 3.0; CMP normal; cholesterol 211, triglycerides 87, HDL 73, LDL 121  Labs 01/26/13: TSH 2.72, free T4 1.39, free T3 2.5  Labs 09/21/12: TSH 5.308, free T4 0.90, free T3 2.5, PTH 53, calcium 9.2, 25-vitamin D 80, 1,25-vitamin D 64  Labs 02/25/12: TSH 3.443, free T4 1.29, free T3 2.7, calcium 9.5 PTH 46.8, 25-hydroxy vitamin D 87, 1,25-dihydroxy vitamin D 53, WBC 5.8, Hgb 13.0, Hct 38.3  Labs 04/25/11: 25 vitamin D was 82. 1,25 vitamin D was 50. PTH was 80.2.      Component Value Date/Time   WBC 8.0 11/08/2019 1543   HGB 13.3 11/08/2019 1543   HCT 40.3 11/08/2019 1543   PLT 248 11/08/2019 1543   CHOL 207 (H) 11/08/2019 1543   TRIG 139 11/08/2019 1543   HDL 75 11/08/2019 1543   ALT 14 11/08/2019 1543   AST 18 11/08/2019 1543   NA 139 11/08/2019 1543   K 4.9 11/08/2019 1543   CL 102 11/08/2019 1543   CREATININE 0.78 11/08/2019 1543   BUN 14 11/08/2019 1543   CO2 31 11/08/2019 1543   TSH 1.16 05/10/2020 1453   FREET4 1.5 05/10/2020 1453   T3FREE 3.2 05/10/2020 1453   HGBA1C 5.4 03/17/2015 1417   MICROALBUR 1.08 05/01/2013 0958   CALCIUM 9.7 05/10/2020 1453   CALCIUM 9.2 08/10/2012 1049   PTH 57 05/10/2020 1453   IMAGING:  06/09/15: DEXA scan: Lumbar  spine had a T-score of -2.6. There had been statistically significant interval decreased in bone mineral density at the following sites compared to exam of 09/17/2012: Left femur -4.4% and right femur -6.4%   Assessment and Plan:   ASSESSMENT:  1. Postsurgical hypothyroidism:   A. Since her thyroidectomy, we have made several changes in her Synthroid dosage over time.   B. At her prior visit her TSH was too low and she was still having some difficulties with early awakening, I suspected that her liver was not degrading thyroid hormones quite as well as it used to do. She needed a small reduction in her Synthroid dose, with the goal for her to have a TSH in the 1.0-2.0 range.  C. At today's visit her TFTs are in the middle of the true physiologic range, with her TSH in the goal range of 1.0-2.0.  2-4. Secondary hyperparathyroidism/hypocalcemia/vitamin D deficiency:  A. Since her thyroidectomy her calcium and PTH levels have also fluctuated over time. When she took in less calcium, her PTH levels increased. The converse has been true.   B. Her PTH, calcium, and vitamin D were all about mid-normal in February 2021 and again in August 2021. 5. Osteopenia/Osteoporosis: Patient's last bone mineral density was at [facility] on 05/22/15: Her lumbar spine T score was -2.6, c/w osteoporosis. There were statistically significant decreases in bone density of 4.4% in the left femur and 6.4% in the right femur. The recommendation from Dr. Rogelia Mireick Cornella, [facility] at [facility] was to maintain adequate dietary intake of calcium and vitamin D and to continue weight-bearing exercise as tolerated. As noted above, her current intake of vitamin D is good and her intake/absorption of calcium is good. She will continue her excellent amount of weight-bearing exercise. We will discuss other options for treatment  as appropriate.  6. Unintentional weight loss: Resolved.   7. Visual field defect, peripheral and bilateral: As  above.   8. Peripheral neuropathy, hereditary/idiopathic: She thinks that she first noticed this problem prior to her thyroid surgery. Her B12, B6, and folate levels have been very normal twice in a row.   PLAN:  1. Diagnostic: Reviewed her recent lab results. Repeat CMP, CBC, TFTs, calcium PTH, and 25-OH vitamin D in 6 months.  2. Therapeutic: Continue her Synthroid regimen of one 125 mcg tablet/day for 1 day per week and one 112 mcg tablet/day for 6 days per week. Adjust Synthroid and calcium doses as needed.  Maintain her vitamin D intake, but try to increase the calcium intake by 20%.  3. Patient education: We discussed all of the above at great length. Continue to keep up her usual exercise regimen. 4. Follow-up: 6 months   Level of Service: This visit lasted in excess of 55 minutes. More than 50% of the visit was devoted to counseling.  Molli Knock, MD, CDE Adult and Pediatric Endocrinology

## 2020-05-18 NOTE — Patient Instructions (Signed)
Follow up visit in 6 months. Please repeat lab tests 1-2 weeks prior.  

## 2020-09-19 ENCOUNTER — Other Ambulatory Visit (HOSPITAL_BASED_OUTPATIENT_CLINIC_OR_DEPARTMENT_OTHER): Payer: Self-pay | Admitting: Internal Medicine

## 2020-09-19 ENCOUNTER — Ambulatory Visit: Payer: Medicare Other | Attending: Internal Medicine

## 2020-09-19 DIAGNOSIS — Z23 Encounter for immunization: Secondary | ICD-10-CM

## 2020-09-19 NOTE — Progress Notes (Signed)
° °  Covid-19 Vaccination Clinic  Name:  KANAYA GUNNARSON    MRN: [id number] DOB: 05/26/1951  09/19/2020  Ms. Seney was observed post Covid-19 immunization for 15 minutes without incident. She was provided with Vaccine Information Sheet and instruction to access the V-Safe system.   Ms. Rettinger was instructed to call 911 with any severe reactions post vaccine:  Difficulty breathing   Swelling of face and throat   A fast heartbeat   A bad rash all over body   Dizziness and weakness   Immunizations Administered    Name Date Dose VIS Date Route   Moderna COVID-19 Vaccine 09/19/2020  1:35 PM 0.5 mL 07/19/2020 Intramuscular   Manufacturer: Gala Murdoch   Lot: [id number]   NDC: 30940-768-08

## 2020-09-27 ENCOUNTER — Telehealth (INDEPENDENT_AMBULATORY_CARE_PROVIDER_SITE_OTHER): Payer: Self-pay | Admitting: "Endocrinology

## 2020-09-27 ENCOUNTER — Other Ambulatory Visit (INDEPENDENT_AMBULATORY_CARE_PROVIDER_SITE_OTHER): Payer: Self-pay

## 2020-09-27 DIAGNOSIS — R634 Abnormal weight loss: Secondary | ICD-10-CM

## 2020-09-27 MED ORDER — BIEST/PROGESTERONE TD CREA: TOPICAL_CREAM | TRANSDERMAL | 99 refills | Status: AC

## 2020-09-27 NOTE — Telephone Encounter (Signed)
  Who's calling (name and relationship to patient) : [facility]   Best contact number: [phone]  Provider they see: Fransico Michael  Reason for call: Needs to confirm the instructions for Estradiol and Progesterone rx.     PRESCRIPTION REFILL ONLY  Name of prescription:  Pharmacy:

## 2020-09-27 NOTE — Telephone Encounter (Signed)
Verbally corrected rx to cream. rx that was sent in was for capsules.

## 2020-10-01 ENCOUNTER — Other Ambulatory Visit: Payer: Self-pay | Admitting: "Endocrinology

## 2020-10-03 ENCOUNTER — Ambulatory Visit: Payer: Medicare Other

## 2020-10-17 ENCOUNTER — Ambulatory Visit: Payer: Medicare Other

## 2020-10-23 ENCOUNTER — Ambulatory Visit: Payer: Medicare Other | Attending: Internal Medicine

## 2020-10-23 ENCOUNTER — Other Ambulatory Visit (HOSPITAL_BASED_OUTPATIENT_CLINIC_OR_DEPARTMENT_OTHER): Payer: Self-pay | Admitting: Internal Medicine

## 2020-10-23 DIAGNOSIS — Z23 Encounter for immunization: Secondary | ICD-10-CM

## 2020-10-23 MED FILL — MODERNA COVID-19 VACCINE 10: 100 | 28 days supply | Qty: 0 | Fill #0

## 2020-10-23 NOTE — Progress Notes (Signed)
   Covid-19 Vaccination Clinic  Name:  Kristina Rios    MRN: [id number] DOB: Oct 15, 1950  10/23/2020  Kristina Rios was observed post Covid-19 immunization for 15 minutes without incident. She was provided with Vaccine Information Sheet and instruction to access the V-Safe system.   Kristina Rios was instructed to call 911 with any severe reactions post vaccine: Marland Kitchen Difficulty breathing  . Swelling of face and throat  . A fast heartbeat  . A bad rash all over body  . Dizziness and weakness   Immunizations Administered    Name Date Dose VIS Date Route   Moderna COVID-19 Vaccine 10/23/2020 12:45 PM 0.5 mL 07/19/2020 Intramuscular   Manufacturer: Gala Murdoch   Lot: [id number]   NDC: 85631-497-02

## 2020-11-10 ENCOUNTER — Other Ambulatory Visit: Payer: Self-pay | Admitting: "Endocrinology

## 2020-11-16 DIAGNOSIS — E890 Postprocedural hypothyroidism: Secondary | ICD-10-CM | POA: Diagnosis not present

## 2020-11-16 DIAGNOSIS — G588 Other specified mononeuropathies: Secondary | ICD-10-CM | POA: Diagnosis not present

## 2020-11-16 DIAGNOSIS — N2581 Secondary hyperparathyroidism of renal origin: Secondary | ICD-10-CM | POA: Diagnosis not present

## 2020-11-16 DIAGNOSIS — E559 Vitamin D deficiency, unspecified: Secondary | ICD-10-CM | POA: Diagnosis not present

## 2020-11-17 LAB — CBC WITH DIFFERENTIAL/PLATELET
Absolute Monocytes: 359 cells/uL (ref 200–950)
Basophils Absolute: 50 cells/uL (ref 0–200)
Basophils Relative: 0.8 %
Eosinophils Absolute: 183 cells/uL (ref 15–500)
Eosinophils Relative: 2.9 %
HCT: 39.1 % (ref 35.0–45.0)
Hemoglobin: 12.8 g/dL (ref 11.7–15.5)
Lymphs Abs: 2029 cells/uL (ref 850–3900)
MCH: 31.3 pg (ref 27.0–33.0)
MCHC: 32.7 g/dL (ref 32.0–36.0)
MCV: 95.6 fL (ref 80.0–100.0)
MPV: 10.6 fL (ref 7.5–12.5)
Monocytes Relative: 5.7 %
Neutro Abs: 3679 cells/uL (ref 1500–7800)
Neutrophils Relative %: 58.4 %
Platelets: 199 10*3/uL (ref 140–400)
RBC: 4.09 10*6/uL (ref 3.80–5.10)
RDW: 11.8 % (ref 11.0–15.0)
Total Lymphocyte: 32.2 %
WBC: 6.3 10*3/uL (ref 3.8–10.8)

## 2020-11-17 LAB — COMPREHENSIVE METABOLIC PANEL
AG Ratio: 2.2 (calc) (ref 1.0–2.5)
ALT: 16 U/L (ref 6–29)
AST: 19 U/L (ref 10–35)
Albumin: 4.4 g/dL (ref 3.6–5.1)
Alkaline phosphatase (APISO): 45 U/L (ref 37–153)
BUN: 14 mg/dL (ref 7–25)
CO2: 30 mmol/L (ref 20–32)
Calcium: 9.5 mg/dL (ref 8.6–10.4)
Chloride: 102 mmol/L (ref 98–110)
Creat: 0.86 mg/dL (ref 0.50–0.99)
Globulin: 2 g/dL (calc) (ref 1.9–3.7)
Glucose, Bld: 84 mg/dL (ref 65–139)
Potassium: 4.1 mmol/L (ref 3.5–5.3)
Sodium: 140 mmol/L (ref 135–146)
Total Bilirubin: 0.5 mg/dL (ref 0.2–1.2)
Total Protein: 6.4 g/dL (ref 6.1–8.1)

## 2020-11-17 LAB — PTH, INTACT AND CALCIUM
Calcium: 9.5 mg/dL (ref 8.6–10.4)
PTH: 42 pg/mL (ref 14–64)

## 2020-11-17 LAB — T4, FREE: Free T4: 1.5 ng/dL (ref 0.8–1.8)

## 2020-11-17 LAB — TSH: TSH: 5.55 mIU/L — ABNORMAL HIGH (ref 0.40–4.50)

## 2020-11-17 LAB — T3, FREE: T3, Free: 2.9 pg/mL (ref 2.3–4.2)

## 2020-11-17 LAB — VITAMIN D 25 HYDROXY (VIT D DEFICIENCY, FRACTURES): Vit D, 25-Hydroxy: 71 ng/mL (ref 30–100)

## 2020-11-20 ENCOUNTER — Other Ambulatory Visit (INDEPENDENT_AMBULATORY_CARE_PROVIDER_SITE_OTHER): Payer: Self-pay

## 2020-11-20 ENCOUNTER — Encounter (INDEPENDENT_AMBULATORY_CARE_PROVIDER_SITE_OTHER): Payer: Self-pay | Admitting: "Endocrinology

## 2020-11-20 ENCOUNTER — Ambulatory Visit (INDEPENDENT_AMBULATORY_CARE_PROVIDER_SITE_OTHER): Payer: Medicare Other | Admitting: "Endocrinology

## 2020-11-20 ENCOUNTER — Other Ambulatory Visit: Payer: Self-pay

## 2020-11-20 VITALS — BP 130/70 | HR 76 | Wt 124.4 lb

## 2020-11-20 DIAGNOSIS — M818 Other osteoporosis without current pathological fracture: Secondary | ICD-10-CM | POA: Diagnosis not present

## 2020-11-20 DIAGNOSIS — E890 Postprocedural hypothyroidism: Secondary | ICD-10-CM

## 2020-11-20 DIAGNOSIS — R634 Abnormal weight loss: Secondary | ICD-10-CM | POA: Diagnosis not present

## 2020-11-20 DIAGNOSIS — E559 Vitamin D deficiency, unspecified: Secondary | ICD-10-CM

## 2020-11-20 DIAGNOSIS — E208 Other hypoparathyroidism: Secondary | ICD-10-CM | POA: Diagnosis not present

## 2020-11-20 MED ORDER — LEVOTHYROXINE SODIUM 112 MCG PO TABS: ORAL_TABLET | ORAL | 1 refills | Status: DC

## 2020-11-20 NOTE — Progress Notes (Signed)
Subjective:  Patient Name: Kristina Rios Date of Birth: 03/25/1951  MRN: [id number]  Kristina LoudKatherine Rios  presents to the office today for follow-up of her posRosalio Loudt-surgical hypothyroidism, osteopenia, secondary hyperparathyroidism, vitamin D deficiency, hypocalcemia, arthropathy, and neuropathy of her right leg and foot secondary to a right ACL repair.  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 Caucasian woman.  Dr. Edwena Rios was unaccompanied.  1. Dr. Edwena Rios was first referred to me on 02/21/05 by her former primary care provider, Dr. Eden EmmsMary John Rios, for low thyroid stimulating hormone level. The patient was 70 years old.  A. Lab data on 09/22/03 showed a TSH of 2.241. However, lab data on 10/12/04 showed a TSH of 0.043 and a free T4 of 1.26. Follow up lab tests on 11/3004 showed a TSH of 0.004 and a free T4 of 1.48. The TPO antibody was 297.3, consistent with Hashimoto's disease.  B. When she had her first visit with me, she had had a recent sinus infection and URI and did not feel well. She had some problems with insomnia and early awakening. She woke up hot every morning, but that had not changed in 20 years. She noticed an occasional irregular heart beat. She also noticed herself feeling somewhat jittery and shaky over the past year. She was also feeling anxious at times. Her periods were regular. Past medical history was positive for a diagnosis of osteopenia made 2 years previously. She had Mnire's disease. She also had seasonal allergies. Surgical history was prominent for 2 knee surgeries, tonsils, and adenoids. 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 Her family history was positive for hypothyroidism and osteopenia in her mother. Her father and paternal grandfather had heart disease.  C. On physical examination, her weight was 129.6 pounds at a height of 5 feet 7-1/2 inches. Her BMI  was 19.9. Her blood pressure was 124/80. Her heart rate was 75. She looked like the slender and fit athlete that she was. She was alert and oriented to person place and time. Her affect and insight were fine. She had no acute distress. She had a tender left maxillary sinus. She had a 20+ gram thyroid gland. The thyroid gland was mildly, but diffusely enlarged. Thyroid gland was nontender. She had 1+ tremor of her hands. She had 2+ palmar moisture. Laboratory data showed a TSH of 0.022. Her free T4 was 1.26. Her free T3 was 3.9. Her TSI level was 107 (normal 0-129).  D. The patient had an active left maxillary sinusitis, which I treated with ciprofloxacin.The patient clearly was hyperthyroid. It appeared at that time that the most likely cause for her hyperthyroidism was that she had Hashitoxicosis. In this condition, the patient has flare ups of Hashimoto's disease. As a result of the thyroid inflammation,  preformed thyroid hormone that was in storage leaks out into the blood causing hyperthyroidism. She clearly had an elevated TPO antibody to fit that hypothesis. However, she also had a high-normal TSI level. It was possible that she had 2 different autoimmune processes going on, both Hashimoto's disease and Graves' disease. I decided to follow her clinically.  2. During the next several years, the patient had a very interesting course of autoimmune thyroid disease.   A. During the next year her TFTs fluctuated, but always remained on the hyperthyroid side of normal. By December of 2006 her TSH increased to 0.255, free T4 decreased  to 1.11, and her free T3 decreased to 3.4. At that point it appeared that her Hashimoto's disease was gradually destroying more thyroid cells and that she would likely be euthyroid within the next year.   B. Unfortunately, by 01/29/06 she became significantly more hyperthyroid again. She was chronically tired. Her energy was low. She was not sleeping well. She was having more hot  flashes. Her heart rate had increased to the low 100s. At times she was having dyspnea on exertion. She was having a lot of stomach upset and frequent stools. She was shaking a lot. She was emotionally up and down. She was having more trouble concentrating. She was sweating more. She was also noticing that she was losing proximal muscle strength in that it was now difficult to get up from a squatting position. These were all signs and symptoms of progressive Graves' disease. Her thyroid gland was 25 grams at the time. She had 2+ tremor of her hands. Her TSH was 0.006. Her free T4 was 1.90. Her free T3 was 7.3. At that point her TSI increased to 1.9. This was a 100-fold different reference range than what we had seen previously. According to this reference range any TSI value less than 1.3 was considered normal. At that point she had clear evidence for active Graves' disease. Since she definitely had both Hashimoto's disease and Graves' disease, it made sense to treat her with methimazole, which would control her Graves' disease until such time as Hashimoto's disease had destroyed enough thyroid cells so that she could no longer be thyrotoxic. I started her on methimazole, 20 mg per day.  C. On February 28, 2006, the patient suspected that she was having an adverse reaction to methimazole. In retrospect, she had taken 20 mg of methimazole per day from May 5th to May 24th. On or about May 24th she developed bilateral ankle swelling that was not painful. She stopped the methimazole then. By May 27, however, her right foot was progressively stiff and painful. On May 30 she had stiffness and pain in her left hand. She subsequently developed more stiffness and pain in her right shoulder and arms. She had trouble walking. She had gone on line and found a case report in the [place] [facility] in which a similar case of arthralgias occurred in a patient on methimazole. She saw Doctor Kristina Rios, who treated her with  prednisone, giving her some relief.  She then saw Dr. Hurley Cisco, a rheumatologist who diagnosed a probable drug reaction. He continued the prednisone on a tapering regimen.   D. When I saw her next on 04/10/06, the pain and swelling was much diminished, but she still had some right wrist tenderness. Although she looked pretty well that day, I knew that the course of prednisone had likely reduced the conversion of T4 to T3, making her look better than she might be in terms of her lab values. Her TSH was 0.008. Free T4 had decreased to 1.55. Free T3 had decreased to 4.4. TSI was 1.4. We elected to watch her for another month to see how she would do. The patient decided to try some herbal supplements that had been recommended to her to see if they would control her Graves' disease.  E. Unfortunately, at the time of her next visit on 09/09/06 it was clear that she was more hyperthyroid. She was feeling somewhat weaker overall. Her energy was not too bad. Her sleep was not great. She was warm all the  time.  She was still very shaky. Her leg muscles were weaker. Her TSH was 0.008. Her free T4 was 2.38. Free T3 was 10.1. Her TSI was 1.2. We discussed the advantages and disadvantages of definitive therapy with either a thyroidectomy or radioactive iodine treatment. She stated she wanted to think on it.  F.  On  10/21/06, her TSH was 0.005. Her free T4 was 2.97. Her free T3 was up to 10.5. Her TPO at that point was even more elevated at 535.9. At that time she asked me for my recommendation for a surgeon for her. I recommended Dr. Darnell Level. She saw Dr. Gerrit Friends at his office and they scheduled her surgery. On 02/02/07 she had a thyroidectomy. In late May she was becoming hypothyroid, so I started her on Synthroid 112 mcg per day. Over time, I gradually changed her Synthroid dose.  3. During the past thirteen years, we have also been concerned about her osteopenia and her unintentional weigh loss.   A. At the time  she was recovering from her thyroid surgery we checked her calcium and bone metabolic studies. Her 25-hydroxy vitamin D was 40. Her 1, 25-dihydroxy vitamin D was 82. Her calcium was 9.4. Her PTH was 73.6, which was just slightly above the upper limit of normal of 72. It appeared at that point that she needed a higher calcium intake. Subsequent labs on 07/10/07 showed a 25-hydroxy vitamin D of 47, a 1,25 vitamin D of 82, a PTH of 31.9, and a calcium 10.1. During the last 5 years her PTH values have varied between 29.8 and 80.2. Her calcium values have varied between 9.0 and 10.0. In general, the better her calcium intake, the lower her parathyroid hormone levels have been.  B. In August of 2012, the first visit that I saw her after our health system's conversion to the [facility] electronic medical record system, her weight was 137 pounds. In June 2013 she weighed 132 pounds. In August 2014 she weighed 127 pounds. In February 2015 and again in November 2015 she weighed 124 pounds. In June 2016 her weight had decreased to 116 pounds and decreased further to 114 pounds in September. At her December 2016 visit her weight had increased to 115 pounds.   At that point we had not identified a definite cause of her unintentional weight loss. It was possible, however, that changing to a diet that was both gluten-free and lactose-free had resulted in a net decrease in calories. She has re-gained weight since then.  4. The patient's last [facility] visit was on 05/18/20. I changed her Synthroid regimen to 125 mcg/day for one day each week and 112 mcg/day for 6 days each week. She takes her Synthroid at bedtime. She changed the brand of calcium, three times a day, last dose about 6 PM.  A. In the interim, she has been doing "pretty well". Her allergies have "been acting up for the past 1-2 months". Her sinuses have been doing well. She has not had to take antibiotics recently.   B. Her husband has severe myositis. His atrial flutter is  controlled with heart medications.      C. She had had "some" ocular migraines recently. She sees Dr. Dagoberto Ligas for this problem. Visual symptoms affected her left visual fields. The symptoms lasted about 30-45 minutes. She does not usually have headache.   D.  Her vitreous detachment is stable. She had a follow up exam in about Summer  2021 with Dr. Dagoberto Ligas. [name]  has not had any further detachments or ocular migraines.   E. 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     F. [name] continues to take Synthroid, 125 mcg/day for 1 day each week and 112 mcg/day for 6 days each week. She has also been on topical estrogen (E3/E2), twice daily; progesterone SR, 50 mg each morning and 100 mg each evening. She is taking about 1250 mg of combined calcium carbonate and calcium citrate per day. She is also taking 2400 IU of vitamin D per day for 4 days per week and 4800 IU three days per week. She is also taking DHEA, 10 mg, daily to reduce her brain fog. She is also taking more vitamin K2. She has also been taking 3 different digestive enzymes. She had been fairly stringent with her almost total gluten-free diet. She is no longer dairy-free. She still eats dark chocolate.    H. She is now has [employer] health insurance coverage anda BCBS supplement.   5. Pertinent Review of Systems:  Constitutional: The patient has felt "pretty good". Her energy level is "good". She falls asleep very easily when she is not active. She gets about 6-7 hours of sleep per night. She still often tends to wake up before dawn. She does not take in much caffeine, none after 3 PM.    Face: She has not had much pressure/pain in the maxillary areas.    Eyes: As above. Vision is fairly good otherwise. Her eyes are still dry and she uses OTC eye drops as needed. There are no other significant eye complaints.  Ears: The decreased hearing in her left ear is about the  same.  Neck: Her neck still occasionally feels tight in the area of her thyroidectomy, which sometimes affects her swallowing big pills and peanuts. The patient has no other complaints of anterior neck swelling, soreness, tenderness,  pressure, discomfort, or difficulty swallowing.  Lungs: No recent problems.   Heart: She has noted 5-10 seconds  Of palpitations occasionally. Heart rate increases with exercise or other physical activity. The patient has no complaints of chest pain or chest pressure. Gastrointestinal: She has not had much reflux since she has been trying to eat dinner earlier. Bowel movents seem normal most of the time. The patient has no complaints of excessive hunger, upset stomach, stomach aches or pains, diarrhea, or constipation. Arms and hands: The discomfort/pains in her right wrist have essentially resolved. The tendinitis in her left elbow has also essentially resolved. She now has been having some left wrist pains.  Legs: Muscle mass and strength seem normal. Her right lateral calf remains relatively numb since her surgery. There are no other complaints of numbness, tingling, burning, or pain. No edema is noted. Feet: She notes continuing numbness and tingling in both feet, especially in her forefeet on both the plantar and dorsal surfaces. There are no other complaints of tingling or burning. No edema is noted. Neuro: She occasionally has had some spider web-like sensations or shooting pains in different extremities at different times.  PAST MEDICAL, FAMILY, AND SOCIAL HISTORY:  Past Medical History:  Diagnosis Date  . Arthropathy   . Complication of anesthesia   . Grave's disease   . Hearing loss on left   . Hyperparathyroidism due to vitamin D deficiency (HCC)   . Hypertrophy, nasal, turbinate   . Hypocalcemia   . Hypothyroidism, acquired, autoimmune   . Hypothyroidism, postop   .  Meniere's disease of left ear   . Neuropathy, peripheral    bilateral feet  .  Neuropathy, peripheral   . Osteopenia   . Osteopenia   . PONV (postoperative nausea and vomiting)   . Thyrotoxicosis with diffuse goiter   . Vitamin D deficiency disease     Family History  Problem Relation Age of Onset  . Thyroid disease Mother   . Cancer Brother   . Hypertension Brother   . Obesity Brother   . Heart disease Father   . Heart disease Paternal Grandfather   . Diabetes Neg Hx   . Anemia Neg Hx   . Kidney disease Neg Hx      Current Outpatient Medications:  .  cholecalciferol (VITAMIN D3) 25 MCG (1000 UT) tablet, Take by mouth daily. Taking 2400IU daily, Disp: , Rfl:  .  Estradiol-Estriol-Progesterone (BIEST/PROGESTERONE) CREA, Take 1 capsule in the morning and 2 capsules at bedtime., Disp: 270 g, Rfl: PRN .  levothyroxine (SYNTHROID) 112 MCG tablet, Take 1 tablet by mouth 6 days a week, Disp: 90 tablet, Rfl: 1 .  magnesium 30 MG tablet, Take 30 mg by mouth 2 (two) times daily., Disp: , Rfl:  .  MELATONIN ER PO, Take 6 mg by mouth., Disp: , Rfl:  .  Omega-3 Fatty Acids (OMEGA 3 PO), Take by mouth., Disp: , Rfl:  .  Progesterone Micronized (PROGESTERONE PO), Take 50 mg by mouth 1 day or 1 dose., Disp: , Rfl:  .  SYNTHROID 125 MCG tablet, TAKE 1 TABLET 1 DAY A WEEK, Disp: 15 tablet, Rfl: 1 .  UNABLE TO FIND, Med Name: Iodine 12.5 once a day, Disp: , Rfl:  .  zinc gluconate 50 MG tablet, Take 50 mg by mouth daily., Disp: , Rfl:  .  ALPHA LIPOIC ACID PO, Take by mouth. (Patient not taking: Reported on 05/18/2020), Disp: , Rfl:  .  calcium carbonate (OS-CAL - DOSED IN MG OF ELEMENTAL CALCIUM) 1250 MG tablet, Take 1 tablet by mouth., Disp: , Rfl:  .  IODINE EX, Apply topically., Disp: , Rfl:  .  IODINE-VITAMIN A PO, Take by mouth. (Patient not taking: No sig reported), Disp: , Rfl:   Allergies as of 11/20/2020 - Review Complete 11/20/2020  Allergen Reaction Noted  . Amoxicillin-pot clavulanate Nausea And Vomiting 02/21/2011  . Methimazole Other (See Comments)  02/21/2011    1. 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 2. 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 She has limited her schedule of meets to only local meets, due to her not wanting to travel far during the covid pandemic.   3. Smoking, alcohol, or drugs: She occasionally drinks alcohol. She has never smoked or used drugs. 4. Primary Care Provider: None. She would like to see Dr. Wylene Simmer.   REVIEW OF SYSTEMS: There are no other significant problems involving her other body systems.   Objective:  Vital Signs:  BP 130/70   Pulse 76   Wt 124 lb 6.4 oz (56.4 kg)   BMI 19.30 kg/m    Wt Readings from Last 3 Encounters:  11/20/20 124 lb 6.4 oz (56.4 kg)  05/18/20 121 lb 1.6 oz (54.9 kg)  11/15/19 120 lb 6.4 oz (54.6 kg)    Ht Readings from Last 3 Encounters:  11/15/19 5' 7.32" (1.71 m)  10/19/18 5' 7.5" (1.715 m)  05/11/18 5' 7.91" (1.725 m)    HC Readings from Last 3 Encounters:  No data found  for [facility]   Body mass index is 19.3 kg/m.  PHYSICAL EXAM:  Constitutional: The patient looks quite good today. She is bright, alert, upbeat, and very personable. Her color is good. Her weight has increased 3 pounds.  Head: The head is normocephalic. Face: The face appears normal.  Eyes: There is no obvious arcus or proptosis. Moisture appears normal.  Mouth: The oropharynx and tongue appear normal. Oral moisture is normal. Gingiva look normal.  Neck: The neck appears to be visibly normal. No carotid bruits are noted. The thyroid gland is absent. She still has very mild residual induration of her left strap muscle, but much less over time.  Lungs: The lungs are clear to auscultation. Air movement is good. Heart: Heart rate and rhythm are regular. Heart sounds S1 and S2 are normal. I did not appreciate any pathologic cardiac murmurs. Abdomen: The abdomen is normal in size for the patient's age. Bowel sounds are normal. There is no obvious  hepatomegaly, splenomegaly, or other mass effect.  Arms: Muscle size and bulk are normal for age.  Hands: There is a trace tremor today. Phalangeal and metacarpophalangeal joints are normal. Palmar muscles are normal for age. Palmar skin is normal. Palmar moisture is normal. Feet: 2+ DP pulses Legs: Muscles appear normal for age. No edema is present. Neurologic: Strength is normal for age in both the upper and lower extremities. Muscle tone is normal. Sensation to touch is essentially normal in her left leg, but slightly decreased in her right lateral leg.   LAB DATA:  Labs 11/16/20: TSH 5.55, free T4 1.5, free T3 2.9; CMP normal; CBC normal; PTH 42, calcium 9.5, 25-OH vitamin D 71  Labs 05/10/20: TSH 1.16, free T4 1.6, free T3 3.2; PTH 57, calcium 9.7, calcitriol 44 (ref 18-72)  Labs 11/08/19: TSH 0.58, free T4 1.6, free T3 3.5; CBC normal; CMP normal; PTH 39 (ref 14-64), calcium 9.6 (ref 8.6-10.4), 25-OH vitamin D 59; non-fasting lipid panel: cholesterol 207, triglycerides 139, HDL 75, LDL 107  Labs 05/05/19: TSH 0.55, free T4 1.5, free T3 3.1; PTH 40, calcium 9.4, 25-OH vitamin D 74  Labs 11/04/18: TSH 0.43, free T4 1.5, free T3 3.2; PTH 27, calcium 9.0, 25-OH vitamin D 48  Labs 05/04/18: TSH 2.08, free T4 1.4, free T3 2.6; PTH 36, calcium 9.9, 25-OH vitamin D 73  Labs 10/20/17: TSH 0.49, free T4 1.7, free T3 3.4  Labs 04/17/17: TSH 0.71, free T4 1.5, free T3 3.2; PTH 46, calcium 9.4, vitamin B6 196.1 (ref 2.1-21.7), vitamin B12 630 (ref [id number]), folate 10.4 (ref >5.5)  Labs 10/24/16: TSH 0.64, free T4 1.6, free T3 3.1; PTH 32, calcium 9.8; vitamin B6 264.3 (ref 2.1-21.7), vitamin B12 563 (ref [id number]), folate 9.9 (ref >5.4)  Labs 10/18/16: 25-OH vitamin D 57  Labs 08/20/16: CBC normal, CMP normal; cholesterol 221, triglycerides 57, HDL 87, LDL 120; 25-OH vitamin D 84; HbA1c 5.4%; AM cortisol 21.4, DHEAS 262 (ref 12-133 if not on DHEA); vitamin B12 511 (ref [id number]); vitamin a 63 (ref  38-98), testosterone 36, free testosterone 1.8 (ref 0.1-6.4); zinc 88 (60-130)  Labs 04/09/16: TSH  1.25, free T4 1.6, free T3 2.5; PTH 34, calcium 9.0, 25-OH vitamin D 73  Labs 01/18/16: TSH 0.92, free T4 1.7, free T3 3.0  Labs 08/21/15: TSH 1.406, free T4 1.63, free T3 2.5; PTH 36, calcium 9.0; 25-OH vitamin D 61  Labs 03/21/15: Fructosamine 250 (normal 190-270)  Labs 03/17/15: HbA1c 5.4%; C-peptide 1.97 (0.80-3.90; 25-OH vitamin D 61; calcium  9.0, PTH 46; TSH 1.095, free T4 1.22, free T3 2.8  Labs 08/03/14: TSH 1.595, free T4 1.48, free T3 2.8; calcium 9.5, PTH 37, 25-hydroxy vitamin D 86, 1,25-dihydroxy vitamin D 50  Labs 11/15/13: TSH 2.345, free T4 1.51, free T3 2.8; PTH 52.4, calcium 9.5, 25-hydroxy vitamin D 94; 1,25-dihydroxy vitamin D 92  Labs 05/01/13: TSH 6.151, free T4 1.45, free T3 3.0; CMP normal; cholesterol 211, triglycerides 87, HDL 73, LDL 121  Labs 01/26/13: TSH 2.72, free T4 1.39, free T3 2.5  Labs 09/21/12: TSH 5.308, free T4 0.90, free T3 2.5, PTH 53, calcium 9.2, 25-vitamin D 80, 1,25-vitamin D 64  Labs 02/25/12: TSH 3.443, free T4 1.29, free T3 2.7, calcium 9.5 PTH 46.8, 25-hydroxy vitamin D 87, 1,25-dihydroxy vitamin D 53, WBC 5.8, Hgb 13.0, Hct 38.3  Labs 04/25/11: 25 vitamin D was 82. 1,25 vitamin D was 50. PTH was 80.2.      Component Value Date/Time   WBC 6.3 11/16/2020 1334   HGB 12.8 11/16/2020 1334   HCT 39.1 11/16/2020 1334   PLT 199 11/16/2020 1334   CHOL 207 (H) 11/08/2019 1543   TRIG 139 11/08/2019 1543   HDL 75 11/08/2019 1543   ALT 16 11/16/2020 1334   AST 19 11/16/2020 1334   NA 140 11/16/2020 1334   K 4.1 11/16/2020 1334   CL 102 11/16/2020 1334   CREATININE 0.86 11/16/2020 1334   BUN 14 11/16/2020 1334   CO2 30 11/16/2020 1334   TSH 5.55 (H) 11/16/2020 1334   FREET4 1.5 11/16/2020 1334   T3FREE 2.9 11/16/2020 1334   HGBA1C 5.4 03/17/2015 1417   MICROALBUR 1.08 05/01/2013 0958   CALCIUM 9.5 11/16/2020 1334   CALCIUM 9.5 11/16/2020 1334    CALCIUM 9.2 08/10/2012 1049   PTH 42 11/16/2020 1334   IMAGING:  06/09/15: DEXA scan: Lumbar spine had a T-score of -2.6. There had been statistically significant interval decreased in bone mineral density at the following sites compared to exam of 09/17/2012: Left femur -4.4% and right femur -6.4%   Assessment and Plan:   ASSESSMENT:  1. Postsurgical hypothyroidism:   A. Since her thyroidectomy, we have made several changes in her Synthroid dosage over time.   B. At her prior visit her TSH was too low and she was still having some difficulties with early awakening, I suspected that her liver was not degrading thyroid hormones quite as well as it used to do. She needed a small reduction in her Synthroid dose, with the goal for her to have a TSH in the 1.0-2.0 range.  C. At her August 2021 visit her TFTs were in the middle of the true physiologic range, with her TSH in the goal range of 1.0-2.0.   D. In February 2022 her TSH is much higher, now in the hypothyroid range. Her free T4 is higher than expected because she took an extra 125 mcg tablet just before having her TFTS drawn. We will change her Synthroid dose today to 125 mcg/day for two days each week and 112 mcg/day for 5 days per week.  2-4. Secondary hyperparathyroidism/hypocalcemia/vitamin D deficiency:  A. Since her thyroidectomy her calcium and PTH levels have also fluctuated over time. When she took in less calcium, her PTH levels increased. The converse has been true.   B. Her PTH, calcium, and vitamin D were all about mid-normal in February 2021 and again in August 2021.  C. Her vitamin D, PTH,  and calcium were good in February 2022.  5. Osteopenia/Osteoporosis: Patient's last bone mineral density [facility] on 05/22/15: Her lumbar spine T score was -2.6, c/w osteoporosis. There were statistically significant decreases in bone density of 4.4% in the left femur and 6.4% in the right femur. The recommendation from  Dr. Rogelia Mire, MD at [facility] was to maintain adequate dietary intake of calcium and vitamin D and to continue weight-bearing exercise as tolerated. As noted above, her current intake of vitamin D is good and her intake/absorption of calcium is good. She will continue her excellent amount of weight-bearing exercise. We will discuss other options for treatment as appropriate.  6. Unintentional weight loss: Resolved.   7. Visual field defect, peripheral and bilateral: As above.   8. Peripheral neuropathy, hereditary/idiopathic: She thinks that she first noticed this problem prior to her thyroid surgery. Her B12, B6, and folate levels have been very normal twice in a row.   PLAN:  1. Diagnostic: Reviewed her recent lab results. Repeat TFTs in 3 months. Repeat CMP, CBC, TFTs, calcium, PTH, and 25-OH vitamin D in 6 months.  2. Therapeutic: Increase her Synthroid regimen to one 125 mcg tablet/day for 2 day per week, for example Sundays and Wednesdays and one 112 mcg tablet/day for 5 days per week. Adjust Synthroid and calcium doses as needed.  Maintain her vitamin D intake, but try to increase the calcium intake by 20%.  3. Patient education: We discussed all of the above at great length. Continue to keep up her usual exercise regimen. 4. Follow-up: 6 months   Level of Service: This visit lasted in excess of 55 minutes. More than 50% of the visit was devoted to counseling.  Molli Knock, MD, CDE [facility]

## 2020-11-20 NOTE — Patient Instructions (Signed)
Follow up visit in 6 months. Please repeat thyroid tests in 3 months. Please repeat all tests in 1-2 weeks prior to next visit.

## 2020-12-05 DIAGNOSIS — J310 Chronic rhinitis: Secondary | ICD-10-CM | POA: Diagnosis not present

## 2020-12-05 DIAGNOSIS — J343 Hypertrophy of nasal turbinates: Secondary | ICD-10-CM | POA: Diagnosis not present

## 2020-12-05 DIAGNOSIS — J320 Chronic maxillary sinusitis: Secondary | ICD-10-CM | POA: Diagnosis not present

## 2020-12-05 DIAGNOSIS — R42 Dizziness and giddiness: Secondary | ICD-10-CM | POA: Diagnosis not present

## 2020-12-05 DIAGNOSIS — H838X3 Other specified diseases of inner ear, bilateral: Secondary | ICD-10-CM | POA: Diagnosis not present

## 2020-12-05 DIAGNOSIS — H903 Sensorineural hearing loss, bilateral: Secondary | ICD-10-CM | POA: Diagnosis not present

## 2020-12-23 ENCOUNTER — Encounter (INDEPENDENT_AMBULATORY_CARE_PROVIDER_SITE_OTHER): Payer: Self-pay

## 2020-12-25 ENCOUNTER — Other Ambulatory Visit (INDEPENDENT_AMBULATORY_CARE_PROVIDER_SITE_OTHER): Payer: Self-pay

## 2020-12-25 MED ORDER — LEVOTHYROXINE SODIUM 125 MCG PO TABS: ORAL_TABLET | ORAL | 2 refills | Status: DC

## 2021-02-07 ENCOUNTER — Encounter (INDEPENDENT_AMBULATORY_CARE_PROVIDER_SITE_OTHER): Payer: Self-pay

## 2021-02-08 ENCOUNTER — Telehealth (INDEPENDENT_AMBULATORY_CARE_PROVIDER_SITE_OTHER): Payer: Self-pay

## 2021-02-09 DIAGNOSIS — H524 Presbyopia: Secondary | ICD-10-CM | POA: Diagnosis not present

## 2021-02-09 DIAGNOSIS — H35371 Puckering of macula, right eye: Secondary | ICD-10-CM | POA: Diagnosis not present

## 2021-02-12 NOTE — Telephone Encounter (Signed)
The medication wasn't denied. I have to have it signed by Dr Fransico Michael. I cant send it electronically.

## 2021-02-12 NOTE — Telephone Encounter (Signed)
  Who's calling (name and relationship to patient) : Kristina Rios ( self)  Best contact number: [phone]  Provider they see: Dr. Fransico Michael  Reason for call: Patient calling her refill request was denied it is not on her medication list. She said it is a compound prescription and usually DR. Fransico Michael will sign off on this and have it filled for her .      PRESCRIPTION REFILL ONLY  Name of prescription:  Pharmacy:

## 2021-02-13 NOTE — Telephone Encounter (Signed)
Prescription has been faxed back to pharmacy.

## 2021-02-19 DIAGNOSIS — E890 Postprocedural hypothyroidism: Secondary | ICD-10-CM | POA: Diagnosis not present

## 2021-02-19 DIAGNOSIS — E559 Vitamin D deficiency, unspecified: Secondary | ICD-10-CM | POA: Diagnosis not present

## 2021-02-19 DIAGNOSIS — R634 Abnormal weight loss: Secondary | ICD-10-CM | POA: Diagnosis not present

## 2021-02-20 LAB — COMPREHENSIVE METABOLIC PANEL
AG Ratio: 2 (calc) (ref 1.0–2.5)
ALT: 18 U/L (ref 6–29)
AST: 22 U/L (ref 10–35)
Albumin: 4.7 g/dL (ref 3.6–5.1)
Alkaline phosphatase (APISO): 44 U/L (ref 37–153)
BUN: 16 mg/dL (ref 7–25)
CO2: 29 mmol/L (ref 20–32)
Calcium: 9.9 mg/dL (ref 8.6–10.4)
Chloride: 101 mmol/L (ref 98–110)
Creat: 0.86 mg/dL (ref 0.50–0.99)
Globulin: 2.3 g/dL (calc) (ref 1.9–3.7)
Glucose, Bld: 73 mg/dL (ref 65–139)
Potassium: 4.3 mmol/L (ref 3.5–5.3)
Sodium: 139 mmol/L (ref 135–146)
Total Bilirubin: 0.6 mg/dL (ref 0.2–1.2)
Total Protein: 7 g/dL (ref 6.1–8.1)

## 2021-02-20 LAB — CBC WITH DIFFERENTIAL/PLATELET
Absolute Monocytes: 475 cells/uL (ref 200–950)
Basophils Absolute: 73 cells/uL (ref 0–200)
Basophils Relative: 1 %
Eosinophils Absolute: 248 cells/uL (ref 15–500)
Eosinophils Relative: 3.4 %
HCT: 42.4 % (ref 35.0–45.0)
Hemoglobin: 14.1 g/dL (ref 11.7–15.5)
Lymphs Abs: 1927 cells/uL (ref 850–3900)
MCH: 31.3 pg (ref 27.0–33.0)
MCHC: 33.3 g/dL (ref 32.0–36.0)
MCV: 94.2 fL (ref 80.0–100.0)
MPV: 10.7 fL (ref 7.5–12.5)
Monocytes Relative: 6.5 %
Neutro Abs: 4577 cells/uL (ref 1500–7800)
Neutrophils Relative %: 62.7 %
Platelets: 247 10*3/uL (ref 140–400)
RBC: 4.5 10*6/uL (ref 3.80–5.10)
RDW: 11.9 % (ref 11.0–15.0)
Total Lymphocyte: 26.4 %
WBC: 7.3 10*3/uL (ref 3.8–10.8)

## 2021-02-20 LAB — PTH, INTACT AND CALCIUM
Calcium: 9.9 mg/dL (ref 8.6–10.4)
PTH: 21 pg/mL (ref 16–77)

## 2021-02-20 LAB — TSH: TSH: 4.65 mIU/L — ABNORMAL HIGH (ref 0.40–4.50)

## 2021-02-20 LAB — VITAMIN D 25 HYDROXY (VIT D DEFICIENCY, FRACTURES): Vit D, 25-Hydroxy: 66 ng/mL (ref 30–100)

## 2021-02-20 LAB — T4, FREE: Free T4: 1.5 ng/dL (ref 0.8–1.8)

## 2021-02-20 LAB — T3, FREE: T3, Free: 3 pg/mL (ref 2.3–4.2)

## 2021-02-23 ENCOUNTER — Encounter (INDEPENDENT_AMBULATORY_CARE_PROVIDER_SITE_OTHER): Payer: Self-pay | Admitting: *Deleted

## 2021-02-27 ENCOUNTER — Other Ambulatory Visit (INDEPENDENT_AMBULATORY_CARE_PROVIDER_SITE_OTHER): Payer: Self-pay

## 2021-02-27 DIAGNOSIS — E890 Postprocedural hypothyroidism: Secondary | ICD-10-CM

## 2021-02-27 MED ORDER — LEVOTHYROXINE SODIUM 125 MCG PO TABS: ORAL_TABLET | ORAL | 2 refills | Status: DC

## 2021-02-27 MED ORDER — LEVOTHYROXINE SODIUM 112 MCG PO TABS: ORAL_TABLET | ORAL | 1 refills | Status: DC

## 2021-04-25 ENCOUNTER — Other Ambulatory Visit (INDEPENDENT_AMBULATORY_CARE_PROVIDER_SITE_OTHER): Payer: Self-pay | Admitting: "Endocrinology

## 2021-04-25 DIAGNOSIS — E890 Postprocedural hypothyroidism: Secondary | ICD-10-CM

## 2021-05-11 ENCOUNTER — Other Ambulatory Visit (HOSPITAL_COMMUNITY): Payer: Self-pay

## 2021-05-11 DIAGNOSIS — E890 Postprocedural hypothyroidism: Secondary | ICD-10-CM | POA: Diagnosis not present

## 2021-05-14 LAB — CBC WITH DIFFERENTIAL/PLATELET
Absolute Monocytes: 389 cells/uL (ref 200–950)
Basophils Absolute: 60 cells/uL (ref 0–200)
Basophils Relative: 0.9 %
Eosinophils Absolute: 328 cells/uL (ref 15–500)
Eosinophils Relative: 4.9 %
HCT: 41 % (ref 35.0–45.0)
Hemoglobin: 13.2 g/dL (ref 11.7–15.5)
Lymphs Abs: 1796 cells/uL (ref 850–3900)
MCH: 30.5 pg (ref 27.0–33.0)
MCHC: 32.2 g/dL (ref 32.0–36.0)
MCV: 94.7 fL (ref 80.0–100.0)
MPV: 10.8 fL (ref 7.5–12.5)
Monocytes Relative: 5.8 %
Neutro Abs: 4127 cells/uL (ref 1500–7800)
Neutrophils Relative %: 61.6 %
Platelets: 221 10*3/uL (ref 140–400)
RBC: 4.33 10*6/uL (ref 3.80–5.10)
RDW: 11.8 % (ref 11.0–15.0)
Total Lymphocyte: 26.8 %
WBC: 6.7 10*3/uL (ref 3.8–10.8)

## 2021-05-14 LAB — COMPREHENSIVE METABOLIC PANEL
AG Ratio: 2.2 (calc) (ref 1.0–2.5)
ALT: 23 U/L (ref 6–29)
AST: 24 U/L (ref 10–35)
Albumin: 4.4 g/dL (ref 3.6–5.1)
Alkaline phosphatase (APISO): 44 U/L (ref 37–153)
BUN: 19 mg/dL (ref 7–25)
CO2: 31 mmol/L (ref 20–32)
Calcium: 9.4 mg/dL (ref 8.6–10.4)
Chloride: 104 mmol/L (ref 98–110)
Creat: 0.69 mg/dL (ref 0.50–1.05)
Globulin: 2 g/dL (calc) (ref 1.9–3.7)
Glucose, Bld: 85 mg/dL (ref 65–139)
Potassium: 4.7 mmol/L (ref 3.5–5.3)
Sodium: 139 mmol/L (ref 135–146)
Total Bilirubin: 0.6 mg/dL (ref 0.2–1.2)
Total Protein: 6.4 g/dL (ref 6.1–8.1)

## 2021-05-14 LAB — PTH, INTACT AND CALCIUM
Calcium: 9.4 mg/dL (ref 8.6–10.4)
PTH: 27 pg/mL (ref 16–77)

## 2021-05-14 LAB — T3, FREE: T3, Free: 3.3 pg/mL (ref 2.3–4.2)

## 2021-05-14 LAB — T4, FREE: Free T4: 1.6 ng/dL (ref 0.8–1.8)

## 2021-05-14 LAB — VITAMIN D 25 HYDROXY (VIT D DEFICIENCY, FRACTURES): Vit D, 25-Hydroxy: 70 ng/mL (ref 30–100)

## 2021-05-14 LAB — TSH: TSH: 3.43 mIU/L (ref 0.40–4.50)

## 2021-05-15 ENCOUNTER — Encounter (INDEPENDENT_AMBULATORY_CARE_PROVIDER_SITE_OTHER): Payer: Self-pay

## 2021-05-20 NOTE — Progress Notes (Signed)
Subjective:  Patient Name: Kristina Rios Date of Birth: 02/10/1951  MRN: [id number]  Kristina Rios  presents to the office today for follow-up of her post-surgical hypothyroidism, osteopenia, secondary hyperparathyroidism, vitamin D deficiency, hypocalcemia, arthropathy, and neuropathy of her right leg and foot secondary to a right ACL repair.  HISTORY OF PRESENT ILLNESS:   Dr. Edwena BlowDevore is a 70 y.o. Caucasian woman.  Dr. Edwena Blowevore was unaccompanied.  1. Dr. Edwena Blowevore was first referred to me on 02/21/05 by her former primary care provider, Dr. Eden EmmsMary John Baxley, for low thyroid stimulating hormone level. The patient was 70 years old.  A. Lab data on 09/22/03 showed a TSH of 2.241. However, lab data on 10/12/04 showed a TSH of 0.043 and a free T4 of 1.26. Follow up lab tests on 11/3004 showed a TSH of 0.004 and a free T4 of 1.48. The TPO antibody was 297.3, consistent with Hashimoto's disease.  B. When she had her first visit with me, she had had a recent sinus infection and URI and did not feel well. She had some problems with insomnia and early awakening. She woke up hot every morning, but that had not changed in 20 years. She noticed an occasional irregular heart beat. She also noticed herself feeling somewhat jittery and shaky over the past year. She was also feeling anxious at times. Her periods were regular. Past medical history was positive for a diagnosis of osteopenia made 2 years previously. She had Mnire's disease. She also had seasonal allergies. Surgical history was prominent for 2 knee surgeries, tonsils, and adenoids. 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 Her family history was positive for hypothyroidism and osteopenia in her mother. Her father and paternal grandfather had heart disease.  C. On physical examination, her weight was 129.6 pounds at a height of 5 feet 7-1/2 inches. Her BMI  was 19.9. Her blood pressure was 124/80. Her heart rate was 75. She looked like the slender and fit athlete that she was. She was alert and oriented to person place and time. Her affect and insight were fine. She had no acute distress. She had a tender left maxillary sinus. She had a 20+ gram thyroid gland. The thyroid gland was mildly, but diffusely enlarged. Thyroid gland was nontender. She had 1+ tremor of her hands. She had 2+ palmar moisture. Laboratory data showed a TSH of 0.022. Her free T4 was 1.26. Her free T3 was 3.9. Her TSI level was 107 (normal 0-129).  D. The patient had an active left maxillary sinusitis, which I treated with ciprofloxacin.The patient clearly was hyperthyroid. It appeared at that time that the most likely cause for her hyperthyroidism was that she had Hashitoxicosis. In this condition, the patient has flare ups of Hashimoto's disease. As a result of the thyroid inflammation,  preformed thyroid hormone that was in storage leaks out into the blood causing hyperthyroidism. She clearly had an elevated TPO antibody to fit that hypothesis. However, she also had a high-normal TSI level. It was possible that she had 2 different autoimmune processes going on, both Hashimoto's disease and Graves' disease. I decided to follow her clinically.  2. During the next several years, the patient had a very interesting course of autoimmune thyroid disease.   A. During the next year her TFTs fluctuated, but always remained on the hyperthyroid side of normal. By December of 2006 her TSH increased to 0.255, free T4 decreased  to 1.11, and her free T3 decreased to 3.4. At that point it appeared that her Hashimoto's disease was gradually destroying more thyroid cells and that she would likely be euthyroid within the next year.   B. Unfortunately, by 01/29/06 she became significantly more hyperthyroid again. She was chronically tired. Her energy was low. She was not sleeping well. She was having more hot  flashes. Her heart rate had increased to the low 100s. At times she was having dyspnea on exertion. She was having a lot of stomach upset and frequent stools. She was shaking a lot. She was emotionally up and down. She was having more trouble concentrating. She was sweating more. She was also noticing that she was losing proximal muscle strength in that it was now difficult to get up from a squatting position. These were all signs and symptoms of progressive Graves' disease. Her thyroid gland was 25 grams at the time. She had 2+ tremor of her hands. Her TSH was 0.006. Her free T4 was 1.90. Her free T3 was 7.3. At that point her TSI increased to 1.9. This was a 100-fold different reference range than what we had seen previously. According to this reference range any TSI value less than 1.3 was considered normal. At that point she had clear evidence for active Graves' disease. Since she definitely had both Hashimoto's disease and Graves' disease, it made sense to treat her with methimazole, which would control her Graves' disease until such time as Hashimoto's disease had destroyed enough thyroid cells so that she could no longer be thyrotoxic. I started her on methimazole, 20 mg per day.  C. On February 28, 2006, the patient suspected that she was having an adverse reaction to methimazole. In retrospect, she had taken 20 mg of methimazole per day from May 5th to May 24th. On or about May 24th she developed bilateral ankle swelling that was not painful. She stopped the methimazole then. By May 27, however, her right foot was progressively stiff and painful. On May 30 she had stiffness and pain in her left hand. She subsequently developed more stiffness and pain in her right shoulder and arms. She had trouble walking. She had gone on line and found a case report in the [place] [facility] in which a similar case of arthralgias occurred in a patient on methimazole. She saw Doctor Renold Genta, who treated her with  prednisone, giving her some relief.  She then saw Dr. Hurley Cisco, a rheumatologist who diagnosed a probable drug reaction. He continued the prednisone on a tapering regimen.   D. When I saw her next on 04/10/06, the pain and swelling was much diminished, but she still had some right wrist tenderness. Although she looked pretty well that day, I knew that the course of prednisone had likely reduced the conversion of T4 to T3, making her look better than she might be in terms of her lab values. Her TSH was 0.008. Free T4 had decreased to 1.55. Free T3 had decreased to 4.4. TSI was 1.4. We elected to watch her for another month to see how she would do. The patient decided to try some herbal supplements that had been recommended to her to see if they would control her Graves' disease.  E. Unfortunately, at the time of her next visit on 09/09/06 it was clear that she was more hyperthyroid. She was feeling somewhat weaker overall. Her energy was not too bad. Her sleep was not great. She was warm all the  time.  She was still very shaky. Her leg muscles were weaker. Her TSH was 0.008. Her free T4 was 2.38. Free T3 was 10.1. Her TSI was 1.2. We discussed the advantages and disadvantages of definitive therapy with either a thyroidectomy or radioactive iodine treatment. She stated she wanted to think on it.  F.  On  10/21/06, her TSH was 0.005. Her free T4 was 2.97. Her free T3 was up to 10.5. Her TPO at that point was even more elevated at 535.9. At that time she asked me for my recommendation for a surgeon for her. I recommended Dr. Darnell Level. She saw Dr. Gerrit Friends at his office and they scheduled her surgery. On 02/02/07 she had a thyroidectomy. In late May she was becoming hypothyroid, so I started her on Synthroid 112 mcg per day. Over time, I gradually changed her Synthroid dose.  3. During the past thirteen years, we have also been concerned about her osteopenia and her unintentional weigh loss.   A. At the time  she was recovering from her thyroid surgery we checked her calcium and bone metabolic studies. Her 25-hydroxy vitamin D was 40. Her 1, 25-dihydroxy vitamin D was 82. Her calcium was 9.4. Her PTH was 73.6, which was just slightly above the upper limit of normal of 72. It appeared at that point that she needed a higher calcium intake. Subsequent labs on 07/10/07 showed a 25-hydroxy vitamin D of 47, a 1,25 vitamin D of 82, a PTH of 31.9, and a calcium 10.1. During the last 5 years her PTH values have varied between 29.8 and 80.2. Her calcium values have varied between 9.0 and 10.0. In general, the better her calcium intake, the lower her parathyroid hormone levels have been.  B. In August of 2012, the first visit that I saw her after our health system's conversion to the [facility] electronic medical record system, her weight was 137 pounds. In June 2013 she weighed 132 pounds. In August 2014 she weighed 127 pounds. In February 2015 and again in November 2015 she weighed 124 pounds. In June 2016 her weight had decreased to 116 pounds and decreased further to 114 pounds in September. At her December 2016 visit her weight had increased to 115 pounds.   At that point we had not identified a definite cause of her unintentional weight loss. It was possible, however, that changing to a diet that was both gluten-free and lactose-free had resulted in a net decrease in calories. She has re-gained weight since then.  4. The patient's last [facility] visit was on 11/20/20. I changed her Synthroid regimen to 125 mcg/day for two days each week and 112 mcg/day for 5 days each week. She takes her Synthroid at bedtime. She changed the brand of calcium, three times a day, last dose about 6 PM. However, after reviewing her lab tests in May, I asked her to change her Synthroid regimen to 125 mcg/day for four days each week and 112 mcg/day for three days each week   A. In the interim, she has been doing "pretty well". Her allergies have "been  acting up somewhat for the past 1-2 months". Her sinuses have been doing well. She has not had to take antibiotics recently.   B. Her husband has severe myositis. He is now receiving a monthly monoclonal antibody infusion. His atrial flutter is controlled with heart medications.      C. She had not had any ocular migraines for more than a month.  She sees  Dr. Dagoberto Ligas for this problem. Visual symptoms affected her left visual fields. The symptoms lasted about 30-45 minutes. She does not usually have headache.   D.  Her vitreous detachment is stable. She had a follow up exam in about Summer  2021 with Dr. Dagoberto Ligas. [name] has not had any further detachments or ocular migraines.   E. 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     F. [name] continues to take Synthroid, 125 mcg/day for 4 days each week and 112 mcg/day for 3 days each week. She has also been on topical estrogen (E3/E2), twice daily; progesterone SR, 50 mg each morning and 100 mg each evening. She is taking about 1250 mg of combined calcium carbonate and calcium citrate per day. She is also taking 2400 IU of vitamin D per day for 4 days per week and 4800 IU three days per week. She is also taking DHEA, 10 mg, daily to reduce her brain fog. She is also taking more vitamin K2. She has also been taking 3 different digestive enzymes. She had been fairly stringent with her almost total gluten-free diet. She is no longer dairy-free. She occasionally eats dark chocolate.    H. She is now has [employer] health insurance coverage and a BCBS supplement.   5. Pertinent Review of Systems:  Constitutional: The patient has felt "pretty good, but somewhat tired at times". Her energy level is "fairly good". She falls asleep very easily when she is not active. She gets about 6-7 hours of sleep per night. She still often tends to wake up before dawn. She does not take in much caffeine,  none after 3 PM.  Her gp;lf game is going very well.  Face: She has not had much pressure/pain in the maxillary areas.    Eyes: As above. Vision is fairly good otherwise. Her eyes are still dry and she uses OTC eye drops as needed. There are no other significant eye complaints.  Ears: The decreased hearing in her left ear is about the same.  Neck: Her neck still occasionally feels tight in the area of her thyroidectomy, which sometimes affects her swallowing big pills and peanuts. The patient has no other complaints of anterior neck swelling, soreness, tenderness,  pressure, discomfort, or difficulty swallowing.  Lungs: No recent problems.   Heart: She has not had any palpitations recently. Heart rate increases with exercise or other physical activity. The patient has no complaints of chest pain or chest pressure. Gastrointestinal: She has not had much reflux since she has been trying to eat dinner earlier. Bowel movents seem normal most of the time. The patient has no complaints of excessive hunger, upset stomach, stomach aches or pains, diarrhea, or constipation. Arms and hands: The discomfort/pains in her right wrist have essentially resolved. The tendinitis in her left elbow recurs occasionally. She now has been having some left wrist pains.  Legs: Muscle mass and strength seem normal. Her right lateral calf remains relatively numb since her surgery. There are no other complaints of numbness, tingling, burning, or pain. No edema is noted. Feet: She notes continuing numbness and tingling in both feet, especially in her forefeet on both the plantar and dorsal surfaces. There are no other complaints of tingling or burning. No edema is noted. Neuro: She occasionally has had some spider web-like sensations or shooting pains in different extremities at different times. Shin: She has rashes on her lower legs due  to golf course chemicals.   PAST MEDICAL, FAMILY, AND SOCIAL HISTORY:  Past Medical History:   Diagnosis Date   Arthropathy    Complication of anesthesia    Grave's disease    Hearing loss on left    Hyperparathyroidism due to vitamin D deficiency (HCC)    Hypertrophy, nasal, turbinate    Hypocalcemia    Hypothyroidism, acquired, autoimmune    Hypothyroidism, postop    Meniere's disease of left ear    Neuropathy, peripheral    bilateral feet   Neuropathy, peripheral    Osteopenia    Osteopenia    PONV (postoperative nausea and vomiting)    Thyrotoxicosis with diffuse goiter    Vitamin D deficiency disease     Family History  Problem Relation Age of Onset   Thyroid disease Mother    Cancer Brother    Hypertension Brother    Obesity Brother    Heart disease Father    Heart disease Paternal Grandfather    Diabetes Neg Hx    Anemia Neg Hx    Kidney disease Neg Hx      Current Outpatient Medications:    calcium carbonate (OS-CAL - DOSED IN MG OF ELEMENTAL CALCIUM) 1250 MG tablet, Take 1 tablet by mouth., Disp: , Rfl:    cholecalciferol (VITAMIN D3) 25 MCG (1000 UT) tablet, Take by mouth daily. Taking 2400IU daily, Disp: , Rfl:    COVID-19 mRNA vaccine, Moderna, 100 MCG/0.5ML injection, INJECT AS DIRECTED, Disp: .5 mL, Rfl: 0   COVID-19 mRNA vaccine, Moderna, 100 MCG/0.5ML injection, INJECT AS DIRECTED, Disp: .25 mL, Rfl: 0   Estradiol-Estriol-Progesterone (BIEST/PROGESTERONE) CREA, Take 1 capsule in the morning and 2 capsules at bedtime., Disp: 270 g, Rfl: PRN   IODINE EX, Apply topically., Disp: , Rfl:    levothyroxine (SYNTHROID) 112 MCG tablet, Take one tablet 3 days a week, Disp: 66 tablet, Rfl: 1   levothyroxine (SYNTHROID) 125 MCG tablet, TAKE 1 TABLET 4 DAYS PER WEEK, Disp: 21 tablet, Rfl: 2   magnesium 30 MG tablet, Take 30 mg by mouth 2 (two) times daily., Disp: , Rfl:    MELATONIN ER PO, Take 6 mg by mouth., Disp: , Rfl:    Omega-3 Fatty Acids (OMEGA 3 PO), Take by mouth., Disp: , Rfl:    Progesterone Micronized (PROGESTERONE PO), Take 50 mg by mouth 1 day  or 1 dose., Disp: , Rfl:    UNABLE TO FIND, Med Name: Iodine 12.5 once a day, Disp: , Rfl:    zinc gluconate 50 MG tablet, Take 50 mg by mouth daily., Disp: , Rfl:    ALPHA LIPOIC ACID PO, Take by mouth. (Patient not taking: No sig reported), Disp: , Rfl:    IODINE-VITAMIN A PO, Take by mouth. (Patient not taking: No sig reported), Disp: , Rfl:   Allergies as of 05/21/2021 - Review Complete 11/20/2020  Allergen Reaction Noted   Amoxicillin-pot clavulanate Nausea And Vomiting 02/21/2011   Methimazole Other (See Comments) 02/21/2011    1. 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 2. Activities: Her golf game is going well.    3. Smoking, alcohol, or drugs: She occasionally drinks alcohol. She has never smoked or used drugs. 4. Primary Care Provider: None.  REVIEW OF SYSTEMS: There are no other significant problems involving her other body systems.   Objective:  Vital Signs:  BP 120/60 (BP Location: Right Arm, Patient Position: Sitting, Cuff Size: Normal)   Pulse 74  Wt 126 lb (57.2 kg)   BMI 19.55 kg/m    Wt Readings from Last 3 Encounters:  05/21/21 126 lb (57.2 kg)  11/20/20 124 lb 6.4 oz (56.4 kg)  05/18/20 121 lb 1.6 oz (54.9 kg)    Ht Readings from Last 3 Encounters:  11/15/19 5' 7.32" (1.71 m)  10/19/18 5' 7.5" (1.715 m)  05/11/18 5' 7.91" (1.725 m)    HC Readings from Last 3 Encounters:  No data found for [facility]   Body mass index is 19.55 kg/m.  PHYSICAL EXAM:  Constitutional: The patient looks quite good today. She is bright, alert, upbeat, and very personable. Her color is good. Her weight has increased almost 2 pounds. It is always a pleasure to see her.  Head: The head is normocephalic. Face: The face appears normal.  Eyes: There is no obvious arcus or proptosis. Moisture appears normal.  Mouth: The oropharynx and tongue appear normal. Oral moisture is normal. Gingiva look normal.  Neck: The neck appears to be visibly normal. No  carotid bruits are noted. The thyroid gland is absent. She still has very mild residual induration of her left strap muscle, but much less over time.  Lungs: The lungs are clear to auscultation. Air movement is good. Heart: Heart rate and rhythm are regular. Heart sounds S1 and S2 are normal. I did not appreciate any pathologic cardiac murmurs. Abdomen: The abdomen is normal in size for the patient's age. Bowel sounds are normal. There is no obvious hepatomegaly, splenomegaly, or other mass effect.  Arms: Muscle size and bulk are normal for age.  Hands: There is a trace tremor today. Phalangeal and metacarpophalangeal joints are normal. Palmar muscles are normal for age. Palmar skin is normal. Palmar moisture is normal. Legs: Muscles appear normal for age. No edema is present. Neurologic: Strength is normal for age in both the upper and lower extremities. Muscle tone is normal. Sensation to touch is essentially normal in her left leg, but slightly decreased in her right lateral leg.  Skin: She has a red rash in on some aspects of the skin of her lower legs, but not others. The rash does not itch, so is not excoriated.   LAB DATA:  Labs 05/11/21; TSH 3.43, free T4 1.6, free  T3 3.3; CMP normal; PTH 27 (ref 16-77), calcium 9.4, 25-OH vitamin D 70  Labs 02/19/21: TSH 4.65, free T4 1.5, free T3 3.0; CMP normal; CBC ; PTH 21 (ref 16-77), calcium 9.4, 25-OH vitamin D 66  Labs 11/16/20: TSH 5.55, free T4 1.5, free T3 2.9; CMP normal; CBC normal; PTH 42, calcium 9.5, 25-OH vitamin D 71  Labs 05/10/20: TSH 1.16, free T4 1.6, free T3 3.2; PTH 57, calcium 9.7, calcitriol 44 (ref 18-72)  Labs 11/08/19: TSH 0.58, free T4 1.6, free T3 3.5; CBC normal; CMP normal; PTH 39 (ref 14-64), calcium 9.6 (ref 8.6-10.4), 25-OH vitamin D 59; non-fasting lipid panel: cholesterol 207, triglycerides 139, HDL 75, LDL 107  Labs 05/05/19: TSH 0.55, free T4 1.5, free T3 3.1; PTH 40, calcium 9.4, 25-OH vitamin D 74  Labs 11/04/18:  TSH 0.43, free T4 1.5, free T3 3.2; PTH 27, calcium 9.0, 25-OH vitamin D 48  Labs 05/04/18: TSH 2.08, free T4 1.4, free T3 2.6; PTH 36, calcium 9.9, 25-OH vitamin D 73  Labs 10/20/17: TSH 0.49, free T4 1.7, free T3 3.4  Labs 04/17/17: TSH 0.71, free T4 1.5, free T3 3.2; PTH 46, calcium 9.4, vitamin B6 196.1 (ref 2.1-21.7), vitamin B12  630 (ref [id number]), folate 10.4 (ref >5.5)  Labs 10/24/16: TSH 0.64, free T4 1.6, free T3 3.1; PTH 32, calcium 9.8; vitamin B6 264.3 (ref 2.1-21.7), vitamin B12 563 (ref [id number]), folate 9.9 (ref >5.4)  Labs 10/18/16: 25-OH vitamin D 57  Labs 08/20/16: CBC normal, CMP normal; cholesterol 221, triglycerides 57, HDL 87, LDL 120; 25-OH vitamin D 84; HbA1c 5.4%; AM cortisol 21.4, DHEAS 262 (ref 12-133 if not on DHEA); vitamin B12 511 (ref [id number]); vitamin a 63 (ref 38-98), testosterone 36, free testosterone 1.8 (ref 0.1-6.4); zinc 88 (60-130)  Labs 04/09/16: TSH  1.25, free T4 1.6, free T3 2.5; PTH 34, calcium 9.0, 25-OH vitamin D 73  Labs 01/18/16: TSH 0.92, free T4 1.7, free T3 3.0  Labs 08/21/15: TSH 1.406, free T4 1.63, free T3 2.5; PTH 36, calcium 9.0; 25-OH vitamin D 61  Labs 03/21/15: Fructosamine 250 (normal 190-270)  Labs 03/17/15: HbA1c 5.4%; C-peptide 1.97 (0.80-3.90; 25-OH vitamin D 61; calcium 9.0, PTH 46; TSH 1.095, free T4 1.22, free T3 2.8  Labs 08/03/14: TSH 1.595, free T4 1.48, free T3 2.8; calcium 9.5, PTH 37, 25-hydroxy vitamin D 86, 1,25-dihydroxy vitamin D 50  Labs 11/15/13: TSH 2.345, free T4 1.51, free T3 2.8; PTH 52.4, calcium 9.5, 25-hydroxy vitamin D 94; 1,25-dihydroxy vitamin D 92  Labs 05/01/13: TSH 6.151, free T4 1.45, free T3 3.0; CMP normal; cholesterol 211, triglycerides 87, HDL 73, LDL 121  Labs 01/26/13: TSH 2.72, free T4 1.39, free T3 2.5  Labs 09/21/12: TSH 5.308, free T4 0.90, free T3 2.5, PTH 53, calcium 9.2, 25-vitamin D 80, 1,25-vitamin D 64  Labs 02/25/12: TSH 3.443, free T4 1.29, free T3 2.7, calcium 9.5 PTH 46.8,  25-hydroxy vitamin D 87, 1,25-dihydroxy vitamin D 53, WBC 5.8, Hgb 13.0, Hct 38.3  Labs 04/25/11: 25 vitamin D was 82. 1,25 vitamin D was 50. PTH was 80.2.      Component Value Date/Time   WBC 6.7 05/11/2021 1031   HGB 13.2 05/11/2021 1031   HCT 41.0 05/11/2021 1031   PLT 221 05/11/2021 1031   CHOL 207 (H) 11/08/2019 1543   TRIG 139 11/08/2019 1543   HDL 75 11/08/2019 1543   ALT 23 05/11/2021 1031   AST 24 05/11/2021 1031   NA 139 05/11/2021 1031   K 4.7 05/11/2021 1031   CL 104 05/11/2021 1031   CREATININE 0.69 05/11/2021 1031   BUN 19 05/11/2021 1031   CO2 31 05/11/2021 1031   TSH 3.43 05/11/2021 1031   FREET4 1.6 05/11/2021 1031   T3FREE 3.3 05/11/2021 1031   HGBA1C 5.4 03/17/2015 1417   MICROALBUR 1.08 05/01/2013 0958   CALCIUM 9.4 05/11/2021 1031   CALCIUM 9.4 05/11/2021 1031   CALCIUM 9.2 08/10/2012 1049   PTH 27 05/11/2021 1031   IMAGING:  06/09/15: DEXA scan: Lumbar spine had a T-score of -2.6. There had been statistically significant interval decreased in bone mineral density at the following sites compared to exam of 09/17/2012: Left femur -4.4% and right femur -6.4%   Assessment and Plan:   ASSESSMENT:  1. Postsurgical hypothyroidism:   A. Since her thyroidectomy, we have made several changes in her Synthroid dosage over time.   B. At her prior visit her TSH was too low and she was still having some difficulties with early awakening, I suspected that her liver was not degrading thyroid hormones quite as well as it used to do. She needed a small reduction in her Synthroid dose, with the goal for her to have  a TSH in the 1.0-2.0 range.  C. At her August 2021 visit her TFTs were in the middle of the true physiologic range, with her TSH in the goal range of 1.0-2.0.   D. In February 2022 her TSH was much higher, so I increased her Synthroid dosage. In May 2022 her TSH was lower, but still too high, so I increased her dosage again. In August 2022 her TSH is lower, but  still above the goal range of 1.0-2.0. I will increase the dose again.   2-4. Secondary hyperparathyroidism/hypocalcemia/vitamin D deficiency:  A. Since her thyroidectomy her calcium and PTH levels have also fluctuated over time. When she took in less calcium, her PTH levels increased. The converse has been true.   B. Her PTH, calcium, and vitamin D were all about mid-normal in February 2021 and again in August 2021.  C. Her vitamin D, PTH,  and calcium were good in February 2022. In August 2022 her PTH and vitamin D were good, but her calcium level was at about the 44%. She had noted that the mg of calcium in the preparations she was taking had changed, so she has had to revise her dosing regimen.  5. Osteopenia/Osteoporosis: Patient's last bone mineral density was at [facility] on 05/22/15: Her lumbar spine T score was -2.6, c/w osteoporosis. There were statistically significant decreases in bone density of 4.4% in the left femur and 6.4% in the right femur. The recommendation from Dr. Rogelia Mire, MD at [facility] was to maintain adequate dietary intake of calcium and vitamin D and to continue weight-bearing exercise as tolerated. As noted above, her current intake of vitamin D is good and her intake/absorption of calcium is good. She will continue her excellent amount of weight-bearing exercise. We will discuss other options for treatment as appropriate.  6. Unintentional weight loss: Resolved    7. Visual field defect, peripheral and bilateral: As above.   8. Peripheral neuropathy, hereditary/idiopathic: She thinks that she first noticed this problem prior to her thyroid surgery. Her B12, B6, and folate levels have been very normal twice in a row.   PLAN:  1. Diagnostic: Reviewed her recent lab results. Repeat TFTs in 3 months. Repeat CMP, CBC, TFTs, calcium, PTH, and 25-OH vitamin D in 6 months.  2. Therapeutic: Increase her Synthroid regimen to one 125 mcg tablet/day for 5 day per week and  take 112 mcg/day on only two days per week., for example Sundays and Wednesdays. Adjust Synthroid and calcium doses as needed.  Maintain her vitamin D intake, but try to increase the calcium intake by 20%.  3. Patient education: We discussed all of the above at great length. Continue to keep up her usual exercise regimen. 4. Follow-up: 6 months   Level of Service: This visit lasted in excess of 65 minutes. More than 50% of the visit was devoted to counseling.  Molli Knock, MD, CDE Adult and Pediatric Endocrinology

## 2021-05-21 ENCOUNTER — Ambulatory Visit (INDEPENDENT_AMBULATORY_CARE_PROVIDER_SITE_OTHER): Payer: Medicare Other | Admitting: "Endocrinology

## 2021-05-21 ENCOUNTER — Encounter (INDEPENDENT_AMBULATORY_CARE_PROVIDER_SITE_OTHER): Payer: Self-pay | Admitting: "Endocrinology

## 2021-05-21 ENCOUNTER — Other Ambulatory Visit: Payer: Self-pay

## 2021-05-21 VITALS — BP 120/60 | HR 74 | Wt 126.0 lb

## 2021-05-21 DIAGNOSIS — E559 Vitamin D deficiency, unspecified: Secondary | ICD-10-CM

## 2021-05-21 DIAGNOSIS — G609 Hereditary and idiopathic neuropathy, unspecified: Secondary | ICD-10-CM | POA: Diagnosis not present

## 2021-05-21 DIAGNOSIS — E890 Postprocedural hypothyroidism: Secondary | ICD-10-CM

## 2021-05-21 DIAGNOSIS — R634 Abnormal weight loss: Secondary | ICD-10-CM | POA: Diagnosis not present

## 2021-05-21 DIAGNOSIS — N2581 Secondary hyperparathyroidism of renal origin: Secondary | ICD-10-CM | POA: Diagnosis not present

## 2021-05-21 NOTE — Patient Instructions (Signed)
Follow p visit in 6 months. Please repeat lab tests in 3 months.   At [facility], we are committed to providing exceptional care. You will receive a patient satisfaction survey through text or email regarding your visit today. Your opinion is important to me. Comments are appreciated.

## 2021-06-07 DIAGNOSIS — J324 Chronic pansinusitis: Secondary | ICD-10-CM | POA: Diagnosis not present

## 2021-06-07 DIAGNOSIS — J310 Chronic rhinitis: Secondary | ICD-10-CM | POA: Diagnosis not present

## 2021-06-22 DIAGNOSIS — M546 Pain in thoracic spine: Secondary | ICD-10-CM | POA: Diagnosis not present

## 2021-06-22 DIAGNOSIS — M25512 Pain in left shoulder: Secondary | ICD-10-CM | POA: Diagnosis not present

## 2021-06-22 DIAGNOSIS — M545 Low back pain, unspecified: Secondary | ICD-10-CM | POA: Diagnosis not present

## 2021-07-02 DIAGNOSIS — M546 Pain in thoracic spine: Secondary | ICD-10-CM | POA: Diagnosis not present

## 2021-07-02 DIAGNOSIS — M545 Low back pain, unspecified: Secondary | ICD-10-CM | POA: Diagnosis not present

## 2021-07-02 DIAGNOSIS — M25512 Pain in left shoulder: Secondary | ICD-10-CM | POA: Diagnosis not present

## 2021-07-20 DIAGNOSIS — M25512 Pain in left shoulder: Secondary | ICD-10-CM | POA: Diagnosis not present

## 2021-07-20 DIAGNOSIS — M545 Low back pain, unspecified: Secondary | ICD-10-CM | POA: Diagnosis not present

## 2021-07-20 DIAGNOSIS — M546 Pain in thoracic spine: Secondary | ICD-10-CM | POA: Diagnosis not present

## 2021-08-03 DIAGNOSIS — M545 Low back pain, unspecified: Secondary | ICD-10-CM | POA: Diagnosis not present

## 2021-08-03 DIAGNOSIS — M546 Pain in thoracic spine: Secondary | ICD-10-CM | POA: Diagnosis not present

## 2021-08-03 DIAGNOSIS — M25512 Pain in left shoulder: Secondary | ICD-10-CM | POA: Diagnosis not present

## 2021-08-04 ENCOUNTER — Other Ambulatory Visit (INDEPENDENT_AMBULATORY_CARE_PROVIDER_SITE_OTHER): Payer: Self-pay | Admitting: "Endocrinology

## 2021-08-04 DIAGNOSIS — E890 Postprocedural hypothyroidism: Secondary | ICD-10-CM

## 2021-08-13 DIAGNOSIS — M9901 Segmental and somatic dysfunction of cervical region: Secondary | ICD-10-CM | POA: Diagnosis not present

## 2021-08-13 DIAGNOSIS — M9904 Segmental and somatic dysfunction of sacral region: Secondary | ICD-10-CM | POA: Diagnosis not present

## 2021-08-13 DIAGNOSIS — M9905 Segmental and somatic dysfunction of pelvic region: Secondary | ICD-10-CM | POA: Diagnosis not present

## 2021-08-13 DIAGNOSIS — M9902 Segmental and somatic dysfunction of thoracic region: Secondary | ICD-10-CM | POA: Diagnosis not present

## 2021-08-13 DIAGNOSIS — M542 Cervicalgia: Secondary | ICD-10-CM | POA: Diagnosis not present

## 2021-08-16 DIAGNOSIS — M9904 Segmental and somatic dysfunction of sacral region: Secondary | ICD-10-CM | POA: Diagnosis not present

## 2021-08-16 DIAGNOSIS — M9902 Segmental and somatic dysfunction of thoracic region: Secondary | ICD-10-CM | POA: Diagnosis not present

## 2021-08-16 DIAGNOSIS — M9903 Segmental and somatic dysfunction of lumbar region: Secondary | ICD-10-CM | POA: Diagnosis not present

## 2021-08-16 DIAGNOSIS — M9905 Segmental and somatic dysfunction of pelvic region: Secondary | ICD-10-CM | POA: Diagnosis not present

## 2021-08-16 DIAGNOSIS — M9901 Segmental and somatic dysfunction of cervical region: Secondary | ICD-10-CM | POA: Diagnosis not present

## 2021-08-17 DIAGNOSIS — M9903 Segmental and somatic dysfunction of lumbar region: Secondary | ICD-10-CM | POA: Diagnosis not present

## 2021-08-17 DIAGNOSIS — M9904 Segmental and somatic dysfunction of sacral region: Secondary | ICD-10-CM | POA: Diagnosis not present

## 2021-08-17 DIAGNOSIS — M9905 Segmental and somatic dysfunction of pelvic region: Secondary | ICD-10-CM | POA: Diagnosis not present

## 2021-08-17 DIAGNOSIS — M9901 Segmental and somatic dysfunction of cervical region: Secondary | ICD-10-CM | POA: Diagnosis not present

## 2021-08-17 DIAGNOSIS — M9902 Segmental and somatic dysfunction of thoracic region: Secondary | ICD-10-CM | POA: Diagnosis not present

## 2021-08-20 DIAGNOSIS — M9904 Segmental and somatic dysfunction of sacral region: Secondary | ICD-10-CM | POA: Diagnosis not present

## 2021-08-20 DIAGNOSIS — M9905 Segmental and somatic dysfunction of pelvic region: Secondary | ICD-10-CM | POA: Diagnosis not present

## 2021-08-20 DIAGNOSIS — M9903 Segmental and somatic dysfunction of lumbar region: Secondary | ICD-10-CM | POA: Diagnosis not present

## 2021-08-20 DIAGNOSIS — M9902 Segmental and somatic dysfunction of thoracic region: Secondary | ICD-10-CM | POA: Diagnosis not present

## 2021-08-20 DIAGNOSIS — M9901 Segmental and somatic dysfunction of cervical region: Secondary | ICD-10-CM | POA: Diagnosis not present

## 2021-08-27 DIAGNOSIS — M9905 Segmental and somatic dysfunction of pelvic region: Secondary | ICD-10-CM | POA: Diagnosis not present

## 2021-08-27 DIAGNOSIS — M9901 Segmental and somatic dysfunction of cervical region: Secondary | ICD-10-CM | POA: Diagnosis not present

## 2021-08-27 DIAGNOSIS — M9902 Segmental and somatic dysfunction of thoracic region: Secondary | ICD-10-CM | POA: Diagnosis not present

## 2021-08-27 DIAGNOSIS — M9904 Segmental and somatic dysfunction of sacral region: Secondary | ICD-10-CM | POA: Diagnosis not present

## 2021-09-12 DIAGNOSIS — R42 Dizziness and giddiness: Secondary | ICD-10-CM | POA: Diagnosis not present

## 2021-09-12 DIAGNOSIS — H9042 Sensorineural hearing loss, unilateral, left ear, with unrestricted hearing on the contralateral side: Secondary | ICD-10-CM | POA: Diagnosis not present

## 2021-09-12 DIAGNOSIS — H838X2 Other specified diseases of left inner ear: Secondary | ICD-10-CM | POA: Diagnosis not present

## 2021-09-12 DIAGNOSIS — H8102 Meniere's disease, left ear: Secondary | ICD-10-CM | POA: Diagnosis not present

## 2021-09-14 DIAGNOSIS — M546 Pain in thoracic spine: Secondary | ICD-10-CM | POA: Diagnosis not present

## 2021-09-14 DIAGNOSIS — M545 Low back pain, unspecified: Secondary | ICD-10-CM | POA: Diagnosis not present

## 2021-09-14 DIAGNOSIS — M25512 Pain in left shoulder: Secondary | ICD-10-CM | POA: Diagnosis not present

## 2021-09-25 DIAGNOSIS — R42 Dizziness and giddiness: Secondary | ICD-10-CM | POA: Diagnosis not present

## 2021-09-26 DIAGNOSIS — M545 Low back pain, unspecified: Secondary | ICD-10-CM | POA: Diagnosis not present

## 2021-09-26 DIAGNOSIS — M546 Pain in thoracic spine: Secondary | ICD-10-CM | POA: Diagnosis not present

## 2021-09-26 DIAGNOSIS — M25512 Pain in left shoulder: Secondary | ICD-10-CM | POA: Diagnosis not present

## 2021-10-03 DIAGNOSIS — H9042 Sensorineural hearing loss, unilateral, left ear, with unrestricted hearing on the contralateral side: Secondary | ICD-10-CM | POA: Diagnosis not present

## 2021-10-03 DIAGNOSIS — H832X2 Labyrinthine dysfunction, left ear: Secondary | ICD-10-CM | POA: Diagnosis not present

## 2021-10-03 DIAGNOSIS — R42 Dizziness and giddiness: Secondary | ICD-10-CM | POA: Diagnosis not present

## 2021-10-04 DIAGNOSIS — M25512 Pain in left shoulder: Secondary | ICD-10-CM | POA: Diagnosis not present

## 2021-10-04 DIAGNOSIS — M545 Low back pain, unspecified: Secondary | ICD-10-CM | POA: Diagnosis not present

## 2021-10-04 DIAGNOSIS — M546 Pain in thoracic spine: Secondary | ICD-10-CM | POA: Diagnosis not present

## 2021-10-18 DIAGNOSIS — M546 Pain in thoracic spine: Secondary | ICD-10-CM | POA: Diagnosis not present

## 2021-10-18 DIAGNOSIS — M25512 Pain in left shoulder: Secondary | ICD-10-CM | POA: Diagnosis not present

## 2021-10-18 DIAGNOSIS — M545 Low back pain, unspecified: Secondary | ICD-10-CM | POA: Diagnosis not present

## 2021-10-30 DIAGNOSIS — M25512 Pain in left shoulder: Secondary | ICD-10-CM | POA: Diagnosis not present

## 2021-10-30 DIAGNOSIS — M545 Low back pain, unspecified: Secondary | ICD-10-CM | POA: Diagnosis not present

## 2021-10-30 DIAGNOSIS — M546 Pain in thoracic spine: Secondary | ICD-10-CM | POA: Diagnosis not present

## 2021-11-21 ENCOUNTER — Ambulatory Visit (INDEPENDENT_AMBULATORY_CARE_PROVIDER_SITE_OTHER): Payer: Medicare Other | Admitting: "Endocrinology

## 2021-11-21 DIAGNOSIS — R634 Abnormal weight loss: Secondary | ICD-10-CM | POA: Diagnosis not present

## 2021-11-21 DIAGNOSIS — E890 Postprocedural hypothyroidism: Secondary | ICD-10-CM | POA: Diagnosis not present

## 2021-11-21 DIAGNOSIS — N2581 Secondary hyperparathyroidism of renal origin: Secondary | ICD-10-CM | POA: Diagnosis not present

## 2021-11-21 DIAGNOSIS — G609 Hereditary and idiopathic neuropathy, unspecified: Secondary | ICD-10-CM | POA: Diagnosis not present

## 2021-11-21 DIAGNOSIS — E559 Vitamin D deficiency, unspecified: Secondary | ICD-10-CM | POA: Diagnosis not present

## 2021-11-22 LAB — COMPREHENSIVE METABOLIC PANEL
AG Ratio: 2.3 (calc) (ref 1.0–2.5)
ALT: 15 U/L (ref 6–29)
AST: 18 U/L (ref 10–35)
Albumin: 4.5 g/dL (ref 3.6–5.1)
Alkaline phosphatase (APISO): 52 U/L (ref 37–153)
BUN: 20 mg/dL (ref 7–25)
CO2: 30 mmol/L (ref 20–32)
Calcium: 9.3 mg/dL (ref 8.6–10.4)
Chloride: 103 mmol/L (ref 98–110)
Creat: 0.87 mg/dL (ref 0.60–1.00)
Globulin: 2 g/dL (calc) (ref 1.9–3.7)
Glucose, Bld: 109 mg/dL (ref 65–139)
Potassium: 4.1 mmol/L (ref 3.5–5.3)
Sodium: 140 mmol/L (ref 135–146)
Total Bilirubin: 0.5 mg/dL (ref 0.2–1.2)
Total Protein: 6.5 g/dL (ref 6.1–8.1)

## 2021-11-22 LAB — PTH, INTACT AND CALCIUM
Calcium: 9.3 mg/dL (ref 8.6–10.4)
PTH: 50 pg/mL (ref 16–77)

## 2021-11-22 LAB — VITAMIN D 25 HYDROXY (VIT D DEFICIENCY, FRACTURES): Vit D, 25-Hydroxy: 69 ng/mL (ref 30–100)

## 2021-11-22 LAB — TSH: TSH: 0.16 mIU/L — ABNORMAL LOW (ref 0.40–4.50)

## 2021-11-22 LAB — T4, FREE: Free T4: 1.7 ng/dL (ref 0.8–1.8)

## 2021-11-22 LAB — T3, FREE: T3, Free: 3.7 pg/mL (ref 2.3–4.2)

## 2021-11-29 ENCOUNTER — Ambulatory Visit (INDEPENDENT_AMBULATORY_CARE_PROVIDER_SITE_OTHER): Payer: Medicare Other | Admitting: "Endocrinology

## 2021-12-03 NOTE — Progress Notes (Signed)
Subjective:  Patient Name: Kristina Rios Date of Birth: November 09, 1950  MRN: [id number]  Kristina Rios  presents to the office today for follow-up of her post-surgical hypothyroidism, osteopenia, secondary hyperparathyroidism, vitamin D deficiency, hypocalcemia, arthropathy, and neuropathy of her right leg and foot secondary to a right ACL repair.  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 Caucasian woman.  [name] was unaccompanied.  1. [name] was first referred to me on 02/21/05 by her former primary care provider, Dr. Tommie Ard Baxley, for low thyroid stimulating hormone level. The patient was 71 years old.  A. Lab data on 09/22/03 showed a TSH of 2.241. However, lab data on 10/12/04 showed a TSH of 0.043 and a free T4 of 1.26. Follow up lab tests on 11/3004 showed a TSH of 0.004 and a free T4 of 1.48. The TPO antibody was 297.3, consistent with Hashimoto's disease.  B. When she had her first visit with me, she had had a recent sinus infection and URI and did not feel well. She had some problems with insomnia and early awakening. She woke up hot every morning, but that had not changed in 20 years. She noticed an occasional irregular heart beat. She also noticed herself feeling somewhat jittery and shaky over the past year. She was also feeling anxious at times. Her periods were regular. Past medical history was positive for a diagnosis of osteopenia made 2 years previously. She had Mnire's disease. She also had seasonal allergies. Surgical history was prominent for 2 knee surgeries, tonsils, and adenoids. 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 Her family history was positive for hypothyroidism and osteopenia in her mother. Her father and paternal grandfather had heart disease.  C. On physical examination, her weight was 129.6 pounds at a height of 5 feet 7-1/2 inches. Her BMI  was 19.9. Her blood pressure was 124/80. Her heart rate was 75. She looked like the slender and fit athlete that she was. She was alert and oriented to person place and time. Her affect and insight were fine. She had no acute distress. She had a tender left maxillary sinus. She had a 20+ gram thyroid gland. The thyroid gland was mildly, but diffusely enlarged. Thyroid gland was nontender. She had 1+ tremor of her hands. She had 2+ palmar moisture. Laboratory data showed a TSH of 0.022. Her free T4 was 1.26. Her free T3 was 3.9. Her TSI level was 107 (normal 0-129).  D. The patient had an active left maxillary sinusitis, which I treated with ciprofloxacin.The patient clearly was hyperthyroid. It appeared at that time that the most likely cause for her hyperthyroidism was that she had Hashitoxicosis. In this condition, the patient has flare ups of Hashimoto's disease. As a result of the thyroid inflammation,  preformed thyroid hormone that was in storage leaks out into the blood causing hyperthyroidism. She clearly had an elevated TPO antibody to fit that hypothesis. However, she also had a high-normal TSI level. It was possible that she had 2 different autoimmune processes going on, both Hashimoto's disease and Graves' disease. I decided to follow her clinically.  2. During the next several years, the patient had a very interesting course of autoimmune thyroid disease.   A. During the next year her TFTs fluctuated, but always remained on the hyperthyroid side of normal. By December of 2006 her TSH increased to 0.255, free T4 decreased  to 1.11, and her free T3 decreased to 3.4. At that point it appeared that her Hashimoto's disease was gradually destroying more thyroid cells and that she would likely be euthyroid within the next year.   B. Unfortunately, by 01/29/06 she became significantly more hyperthyroid again. She was chronically tired. Her energy was low. She was not sleeping well. She was having more hot  flashes. Her heart rate had increased to the low 100s. At times she was having dyspnea on exertion. She was having a lot of stomach upset and frequent stools. She was shaking a lot. She was emotionally up and down. She was having more trouble concentrating. She was sweating more. She was also noticing that she was losing proximal muscle strength in that it was now difficult to get up from a squatting position. These were all signs and symptoms of progressive Graves' disease. Her thyroid gland was 25 grams at the time. She had 2+ tremor of her hands. Her TSH was 0.006. Her free T4 was 1.90. Her free T3 was 7.3. At that point her TSI increased to 1.9. This was a 100-fold different reference range than what we had seen previously. According to this reference range any TSI value less than 1.3 was considered normal. At that point she had clear evidence for active Graves' disease. Since she definitely had both Hashimoto's disease and Graves' disease, it made sense to treat her with methimazole, which would control her Graves' disease until such time as Hashimoto's disease had destroyed enough thyroid cells so that she could no longer be thyrotoxic. I started her on methimazole, 20 mg per day.  C. On February 28, 2006, the patient suspected that she was having an adverse reaction to methimazole. In retrospect, she had taken 20 mg of methimazole per day from May 5th to May 24th. On or about May 24th she developed bilateral ankle swelling that was not painful. She stopped the methimazole then. By May 27, however, her right foot was progressively stiff and painful. On May 30 she had stiffness and pain in her left hand. She subsequently developed more stiffness and pain in her right shoulder and arms. She had trouble walking. She had gone on line and found a case report in the [employer] in which a similar case of arthralgias occurred in a patient on methimazole. She saw Doctor Renold Genta, who treated her with  prednisone, giving her some relief.  She then saw Dr. Hurley Cisco, a rheumatologist who diagnosed a probable drug reaction. He continued the prednisone on a tapering regimen.   D. When I saw her next on 04/10/06, the pain and swelling was much diminished, but she still had some right wrist tenderness. Although she looked pretty well that day, I knew that the course of prednisone had likely reduced the conversion of T4 to T3, making her look better than she might be in terms of her lab values. Her TSH was 0.008. Free T4 had decreased to 1.55. Free T3 had decreased to 4.4. TSI was 1.4. We elected to watch her for another month to see how she would do. The patient decided to try some herbal supplements that had been recommended to her to see if they would control her Graves' disease.  E. Unfortunately, at the time of her next visit on 09/09/06 it was clear that she was more hyperthyroid. She was feeling somewhat weaker overall. Her energy was not too bad. Her sleep was not great. She was warm all the  time.  She was still very shaky. Her leg muscles were weaker. Her TSH was 0.008. Her free T4 was 2.38. Free T3 was 10.1. Her TSI was 1.2. We discussed the advantages and disadvantages of definitive therapy with either a thyroidectomy or radioactive iodine treatment. She stated she wanted to think on it.  F.  On  10/21/06, her TSH was 0.005. Her free T4 was 2.97. Her free T3 was up to 10.5. Her TPO at that point was even more elevated at 535.9. At that time she asked me for my recommendation for a surgeon for her. I recommended Dr. Armandina Gemma. She saw Dr. Harlow Asa at his office and they scheduled her surgery. On 02/02/07 she had a thyroidectomy. In late May she was becoming hypothyroid, so I started her on Synthroid 112 mcg per day. Over time, I gradually changed her Synthroid dose.  3. During the past fifteen years, we have also been concerned about her osteopenia and her unintentional weigh loss.   A. At the time  she was recovering from her thyroid surgery we checked her calcium and bone metabolic studies. Her 25-hydroxy vitamin D was 40. Her 1, 25-dihydroxy vitamin D was 82. Her calcium was 9.4. Her PTH was 73.6, which was just slightly above the upper limit of normal of 72. It appeared at that point that she needed a higher calcium intake. Subsequent labs on 07/10/07 showed a 25-hydroxy vitamin D of 47, a 1,25 vitamin D of 82, a PTH of 31.9, and a calcium 10.1. During the last 5 years her PTH values have varied between 29.8 and 80.2. Her calcium values have varied between 9.0 and 10.0. In general, the better her calcium intake, the lower her parathyroid hormone levels have been.  B. In August of 2012, the first visit that I saw her after our health system's conversion to the [facility] electronic medical record system, her weight was 137 pounds. In June 2013 she weighed 132 pounds. In August 2014 she weighed 127 pounds. In February 2015 and again in November 2015 she weighed 124 pounds. In June 2016 her weight had decreased to 116 pounds and decreased further to 114 pounds in September. At her December 2016 visit her weight had increased to 115 pounds.   At that point we had not identified a definite cause of her unintentional weight loss. It was possible, however, that changing to a diet that was both gluten-free and lactose-free had resulted in a net decrease in calories. She has re-gained weight since then.  4. The patient's last [facility] visit was on 05/21/21. I changed her Synthroid regimen to 125 mcg/day for five days each week and 112 mcg/day for 2 days each week. She takes her Synthroid at bedtime. She has changed the brand of calcium several times.  She was supposed to have TFTs done in 3 months, but did not.   A. In the interim, she has been doing "pretty well", but has had more "digestive problems". Her allergies have not been acting up much. Her sinuses have been doing well. She has not had to take antibiotics  recently.   B. Her husband, Lawanna Kobus, has severe myositis/PMR. He is now receiving a monthly monoclonal antibody infusion. His atrial flutter is controlled with heart medications. He has been on prednisone for about two years, but recently discontinued that medication. He has not been evaluated for adrenal suppression. [name] asked if I would see him and I agreed. He works from home, so could be available  on a short-term basis      C. She had more dizziness in January 2023, but none since. She then had vestibular testing. She was told she has a central form of vertigo.  D. She had had occasional ocular migraines.  She sees an ophthalmologist for this problem. Visual symptoms affect her left visual field, or the right field, or the entire field. . The symptoms lasted about 30-45 minutes. She does not usually have headache or much of a headache. When she has the HA, the HA occurs near the end of the visual symptoms.  E.  Her vitreous detachment is stable. She had a follow up exam in about May 2022. [name] has not had any further detachments or ocular migraines.   F. 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    G. [name] continues to take Synthroid, 125 mcg/day for 5 days each week and 112 mcg/day for 2 days each week. She has also been on topical estrogen (E3/E2), twice daily; progesterone SR, 50 mg each morning and 100 mg each evening. She is taking about 1200-1300 mg of combined calcium carbonate and calcium citrate per day. She is also taking 2400 IU of vitamin D per day for 4 days per week and 4800 IU three days per week. She stopped taking DHEA, 10 mg, daily about three weeks ago. Her brain fog has not recurred. She is also taking more vitamin K2. She has also been taking 3 different digestive enzymes. She had been fairly stringent with her almost total gluten-free diet. She is no longer dairy-free.  She occasionally eats dark chocolate.    H. She is now has [employer] health insurance coverage and a BCBS supplement.  I. She began to note "digestive problems" in late 2022. She complained of belching and burping. In January she was having more reflux, some nausea, and the feeling of liquid in her throat. Peptobismol helped. She has also had symptoms of abdominal fullness and occasional burning sensations and pains in her lower abdomen. Stools have been normal, cylindrical long pieces. When she feels "blah", she does not want to eat as much. She has seen a naturopath in [place] who is following her for this problem.    5. Pertinent Review of Systems:  Constitutional: The patient has felt "pretty good, but she has been having more gut discomfort. She is not especially fatigued. Her energy level is "fairly good". She falls asleep very easily when she is not active. She gets about 6-7 hours of sleep per night. She tends to sleep in later at times. She does not take in much caffeine, none after 3 PM.  Her golf game is going very well.  Face: She has not had much pressure/pain in the maxillary areas.    Eyes: As above. Vision is fairly good otherwise. Her eyes are still dry and she uses OTC eye drops as needed. There are no other significant eye complaints.  Ears: The decreased hearing in her left ear is about the same.  Neck: Her neck still occasionally feels tight in the area of her thyroidectomy, which sometimes affects her swallowing big pills and peanuts. The patient has no other complaints of anterior neck swelling, soreness, tenderness,  pressure, discomfort, or difficulty swallowing.  Lungs: No recent problems.   Heart: She has not had many palpitations recently. Heart rate increases with exercise or other physical activity. The patient has  no complaints of chest pain or chest pressure. Gastrointestinal: As above. She has had more reflux sin the past week.  Bowel movents seem normal most of the time. The  patient has no complaints of excessive hunger, upset stomach, stomach aches or pains, diarrhea, or constipation. Arms and hands: The discomfort/pains in her right wrist have essentially resolved. The tendinitis in her left elbow recurs occasionally. She now has been having some left wrist pains.  Legs: Muscle mass and strength seem normal. Her right lateral calf remains relatively numb since her surgery. There are no other complaints of numbness, tingling, burning, or pain. No edema is noted. Feet: She notes continuing numbness and tingling in both feet, especially in her forefeet on both the plantar and dorsal surfaces. There are no other complaints of tingling or burning. No edema is noted. Neuro: She occasionally has had some spider web-like sensations or shooting pains in different extremities at different times. Shin: She occasionally has rashes on her lower legs due to golf course chemicals.   PAST MEDICAL, FAMILY, AND SOCIAL HISTORY:  Past Medical History:  Diagnosis Date   Arthropathy    Complication of anesthesia    Grave's disease    Hearing loss on left    Hyperparathyroidism due to vitamin D deficiency (HCC)    Hypertrophy, nasal, turbinate    Hypocalcemia    Hypothyroidism, acquired, autoimmune    Hypothyroidism, postop    Meniere's disease of left ear    Neuropathy, peripheral    bilateral feet   Neuropathy, peripheral    Osteopenia    Osteopenia    PONV (postoperative nausea and vomiting)    Thyrotoxicosis with diffuse goiter    Vitamin D deficiency disease     Family History  Problem Relation Age of Onset   Thyroid disease Mother    Cancer Brother    Hypertension Brother    Obesity Brother    Heart disease Father    Heart disease Paternal Grandfather    Diabetes Neg Hx    Anemia Neg Hx    Kidney disease Neg Hx      Current Outpatient Medications:    calcium carbonate (OS-CAL - DOSED IN MG OF ELEMENTAL CALCIUM) 1250 MG tablet, Take 1 tablet by mouth.,  Disp: , Rfl:    cholecalciferol (VITAMIN D3) 25 MCG (1000 UT) tablet, Take by mouth daily. Taking 2400IU daily, Disp: , Rfl:    Estradiol-Estriol-Progesterone (BIEST/PROGESTERONE) CREA, Take 1 capsule in the morning and 2 capsules at bedtime., Disp: 270 g, Rfl: PRN   levothyroxine (SYNTHROID) 112 MCG tablet, Take one tablet 3 days a week, Disp: 66 tablet, Rfl: 1   levothyroxine (SYNTHROID) 125 MCG tablet, TAKE 1 TABLET 5 DAYS PER WEEK, Disp: 60 tablet, Rfl: 1   magnesium 30 MG tablet, Take 30 mg by mouth 2 (two) times daily., Disp: , Rfl:    MELATONIN ER PO, Take 6 mg by mouth., Disp: , Rfl:    Progesterone Micronized (PROGESTERONE PO), Take 50 mg by mouth 1 day or 1 dose., Disp: , Rfl:    ALPHA LIPOIC ACID PO, Take by mouth. (Patient not taking: Reported on 05/18/2020), Disp: , Rfl:    IODINE EX, Apply topically. (Patient not taking: Reported on 12/04/2021), Disp: , Rfl:    IODINE-VITAMIN A PO, Take by mouth. (Patient not taking: Reported on 05/18/2020), Disp: , Rfl:    Omega-3 Fatty Acids (OMEGA 3 PO), Take by mouth. (Patient not taking: Reported on 12/04/2021), Disp: , Rfl:  UNABLE TO FIND, Med Name: Iodine 12.5 once a day (Patient not taking: Reported on 12/04/2021), Disp: , Rfl:    zinc gluconate 50 MG tablet, Take 50 mg by mouth daily. (Patient not taking: Reported on 12/04/2021), Disp: , Rfl:   Allergies as of 12/04/2021 - Review Complete 12/04/2021  Allergen Reaction Noted   Amoxicillin-pot clavulanate Nausea And Vomiting 02/21/2011   Methimazole Other (See Comments) 02/21/2011    1. Work and Family: She continues to work as a Solicitor. 2. Activities: Her golf game is going well.    3. Smoking, alcohol, or drugs: She occasionally drinks alcohol. She has never smoked or used drugs. 4. Primary Care Provider: None.  REVIEW OF SYSTEMS: There are no other significant problems involving her other body systems.   Objective:  Vital Signs:  BP 104/62 (BP Location: Right Arm,  Patient Position: Sitting, Cuff Size: Normal)    Pulse 76    Wt 124 lb 6.4 oz (56.4 kg)    BMI 19.30 kg/m    Wt Readings from Last 3 Encounters:  12/04/21 124 lb 6.4 oz (56.4 kg)  05/21/21 126 lb (57.2 kg)  11/20/20 124 lb 6.4 oz (56.4 kg)    Ht Readings from Last 3 Encounters:  11/15/19 5' 7.32" (1.71 m)  10/19/18 5' 7.5" (1.715 m)  05/11/18 5' 7.91" (1.725 m)    HC Readings from Last 3 Encounters:  No data found for [facility]   Body mass index is 19.3 kg/m.  PHYSICAL EXAM:  Constitutional: The patient looks quite good today. She is bright, alert, upbeat, and very personable. Her color is good. Her weight has decreased 2 pounds. It is always a pleasure to see her.  Head: The head is normocephalic. Face: The face appears normal.  Eyes: There is no obvious arcus or proptosis. Moisture appears normal.  Mouth: The oropharynx and tongue appear normal. Oral moisture is normal. Gingiva look normal.  Neck: The neck appears to be visibly normal. No carotid bruits are noted. The thyroid gland is absent. She still has very mild residual induration of her left strap muscle, but much less over time.  Lungs: The lungs are clear to auscultation. Air movement is good. Heart: Heart rate and rhythm are regular. Heart sounds S1 and S2 are normal. I did not appreciate any pathologic cardiac murmurs. Abdomen: The abdomen is normal in size for the patient's age. Bowel sounds are normal. There is no obvious hepatomegaly, splenomegaly, or other mass effect. Her hypogastrium is very mildly sensitive to palpation.  Arms: Muscle size and bulk are normal for age.  Hands: There is a trace tremor today. Phalangeal and metacarpophalangeal joints are normal. Palmar muscles are normal for age. Palmar skin is normal. Palmar moisture is normal. Legs: Muscles appear normal for age. No edema is present. Neurologic: Strength is normal for age in both the upper and lower extremities. Muscle tone is normal. Sensation to touch  is essentially normal in her left leg, but slightly decreased in her right lateral leg.  Skin: She has minimal red rash on her left leg today. The rash does not itch, so is not excoriated.   LAB DATA:  Labs 11/21/21: TSH 0.16, free T4 1.7, free T3 3.7; CMP normal; PTH 50 (ref 16-77), calcium 9.3 (ref 8.6-10.4), 25-OH vitamin D 69  Labs 05/11/21; TSH 3.43, free T4 1.6, free  T3 3.3; CMP normal; PTH 27 (ref 16-77), calcium 9.4, 25-OH vitamin D 70  Labs 02/19/21: TSH 4.65, free T4 1.5,  free T3 3.0; CMP normal; CBC ; PTH 21 (ref 16-77), calcium 9.4, 25-OH vitamin D 66  Labs 11/16/20: TSH 5.55, free T4 1.5, free T3 2.9; CMP normal; CBC normal; PTH 42, calcium 9.5, 25-OH vitamin D 71  Labs 05/10/20: TSH 1.16, free T4 1.6, free T3 3.2; PTH 57, calcium 9.7, calcitriol 44 (ref 18-72)  Labs 11/08/19: TSH 0.58, free T4 1.6, free T3 3.5; CBC normal; CMP normal; PTH 39 (ref 14-64), calcium 9.6 (ref 8.6-10.4), 25-OH vitamin D 59; non-fasting lipid panel: cholesterol 207, triglycerides 139, HDL 75, LDL 107  Labs 05/05/19: TSH 0.55, free T4 1.5, free T3 3.1; PTH 40, calcium 9.4, 25-OH vitamin D 74  Labs 11/04/18: TSH 0.43, free T4 1.5, free T3 3.2; PTH 27, calcium 9.0, 25-OH vitamin D 48  Labs 05/04/18: TSH 2.08, free T4 1.4, free T3 2.6; PTH 36, calcium 9.9, 25-OH vitamin D 73  Labs 10/20/17: TSH 0.49, free T4 1.7, free T3 3.4  Labs 04/17/17: TSH 0.71, free T4 1.5, free T3 3.2; PTH 46, calcium 9.4, vitamin B6 196.1 (ref 2.1-21.7), vitamin B12 630 (ref [phone]), folate 10.4 (ref >5.5)  Labs 10/24/16: TSH 0.64, free T4 1.6, free T3 3.1; PTH 32, calcium 9.8; vitamin B6 264.3 (ref 2.1-21.7), vitamin B12 563 (ref [phone]), folate 9.9 (ref >5.4)  Labs 10/18/16: 25-OH vitamin D 57  Labs 08/20/16: CBC normal, CMP normal; cholesterol 221, triglycerides 57, HDL 87, LDL 120; 25-OH vitamin D 84; HbA1c 5.4%; AM cortisol 21.4, DHEAS 262 (ref 12-133 if not on DHEA); vitamin B12 511 (ref [phone]); vitamin a 63 (ref 38-98),  testosterone 36, free testosterone 1.8 (ref 0.1-6.4); zinc 88 (60-130)  Labs 04/09/16: TSH  1.25, free T4 1.6, free T3 2.5; PTH 34, calcium 9.0, 25-OH vitamin D 73  Labs 01/18/16: TSH 0.92, free T4 1.7, free T3 3.0  Labs 08/21/15: TSH 1.406, free T4 1.63, free T3 2.5; PTH 36, calcium 9.0; 25-OH vitamin D 61  Labs 03/21/15: Fructosamine 250 (normal 190-270)  Labs 03/17/15: HbA1c 5.4%; C-peptide 1.97 (0.80-3.90; 25-OH vitamin D 61; calcium 9.0, PTH 46; TSH 1.095, free T4 1.22, free T3 2.8  Labs 08/03/14: TSH 1.595, free T4 1.48, free T3 2.8; calcium 9.5, PTH 37, 25-hydroxy vitamin D 86, 1,25-dihydroxy vitamin D 50  Labs 11/15/13: TSH 2.345, free T4 1.51, free T3 2.8; PTH 52.4, calcium 9.5, 25-hydroxy vitamin D 94; 1,25-dihydroxy vitamin D 92  Labs 05/01/13: TSH 6.151, free T4 1.45, free T3 3.0; CMP normal; cholesterol 211, triglycerides 87, HDL 73, LDL 121  Labs 01/26/13: TSH 2.72, free T4 1.39, free T3 2.5  Labs 09/21/12: TSH 5.308, free T4 0.90, free T3 2.5, PTH 53, calcium 9.2, 25-vitamin D 80, 1,25-vitamin D 64  Labs 02/25/12: TSH 3.443, free T4 1.29, free T3 2.7, calcium 9.5 PTH 46.8, 25-hydroxy vitamin D 87, 1,25-dihydroxy vitamin D 53, WBC 5.8, Hgb 13.0, Hct 38.3  Labs 04/25/11: 25 vitamin D was 82. 1,25 vitamin D was 50. PTH was 80.2.      Component Value Date/Time   WBC 6.7 05/11/2021 1031   HGB 13.2 05/11/2021 1031   HCT 41.0 05/11/2021 1031   PLT 221 05/11/2021 1031   CHOL 207 (H) 11/08/2019 1543   TRIG 139 11/08/2019 1543   HDL 75 11/08/2019 1543   ALT 15 11/21/2021 1500   AST 18 11/21/2021 1500   NA 140 11/21/2021 1500   K 4.1 11/21/2021 1500   CL 103 11/21/2021 1500   CREATININE 0.87 11/21/2021 1500   BUN 20 11/21/2021  1500   CO2 30 11/21/2021 1500   TSH 0.16 (L) 11/21/2021 1500   FREET4 1.7 11/21/2021 1500   T3FREE 3.7 11/21/2021 1500   HGBA1C 5.4 03/17/2015 1417   MICROALBUR 1.08 05/01/2013 0958   CALCIUM 9.3 11/21/2021 1500   CALCIUM 9.3 11/21/2021 1500    CALCIUM 9.2 08/10/2012 1049   PTH 50 11/21/2021 1500   IMAGING:  06/09/15: DEXA scan: Lumbar spine had a T-score of -2.6. There had been statistically significant interval decreased in bone mineral density at the following sites compared to exam of 09/17/2012: Left femur -4.4% and right femur -6.4%   Assessment and Plan:   ASSESSMENT:  1. Postsurgical hypothyroidism:   A. Since her thyroidectomy, we have made several changes in her Synthroid dosage over time.   B. At her prior visit her TSH was too low and she was still having some difficulties with early awakening, I suspected that her liver was not degrading thyroid hormones quite as well as it used to do. She needed a small reduction in her Synthroid dose, with the goal for her to have a TSH in the 1.0-2.0 range.  C. At her August 2021 visit her TFTs were in the middle of the true physiologic range, with her TSH in the goal range of 1.0-2.0.   D. In February 2022 her TSH was much higher, so I increased her Synthroid dosage. In May 2022 her TSH was lower, but still too high, so I increased her dosage again. In August 2022 her TSH was lower, but still above the goal range of 1.0-2.0. I increased the dose again. Her TFTs are hyperthyroid in February 2023, but she is clinically pretty much euthyroid.  2-4. Secondary hyperparathyroidism/hypocalcemia/vitamin D deficiency:  A. Since her thyroidectomy her calcium and PTH levels have also fluctuated over time. When she took in less calcium, her PTH levels increased. The converse has been true.   B. Her PTH, calcium, and vitamin D were all about mid-normal in February 2021 and again in August 2021.  C. Her vitamin D, PTH,  and calcium were good in February 2022. In August 2022 her PTH and vitamin D were good, but her calcium level was at about the 44%. In February 2023 her PTH was higher, but still normal. Her calcium was lower, but still normal. Her vitamin D was good. I suspect that her current calcium  brand is not as well absorbed as what she took previously.  5. Osteopenia/Osteoporosis: Patient's last bone mineral density was at [facility] on 05/22/15: Her lumbar spine T score was -2.6, c/w osteoporosis. There were statistically significant decreases in bone density of 4.4% in the left femur and 6.4% in the right femur. The recommendation from Dr. Christene Slates, MD at [facility] was to maintain adequate dietary intake of calcium and vitamin D and to continue weight-bearing exercise as tolerated. As noted above, her current intake of vitamin D is good and her intake/absorption of calcium is good. She will continue her excellent amount of weight-bearing exercise. We will discuss other options for treatment as appropriate.  6. Unintentional weight loss: Resolved    7. Visual field defect, peripheral and bilateral: As above.   8. Peripheral neuropathy, hereditary/idiopathic: She thinks that she first noticed this problem prior to her thyroid surgery. Her B12, B6, and folate levels have been very normal several times.    PLAN:  1. Diagnostic: Reviewed her recent lab results. Repeat TFTs, calcium, and PTH in 3 months. Repeat CMP, CBC, TFTs,  calcium, PTH, and 25-OH vitamin D in 6 months.  2. Therapeutic: Decrease her Synthroid regimen to one 125 mcg tablet/day for 4 day per week and take 112 mcg/day on three days per week., for example Sundays, Wednesdays, Fridays. Adjust Synthroid and calcium doses as needed.  Maintain her vitamin D intake, but try to increase the calcium intake by 20%. Consider referring her to Dr. Lorenso Courier in [place]. 3. Patient education: We discussed all of the above at great length. Continue to keep up her usual exercise regimen. 4. Follow-up: 6 months   Level of Service: This visit lasted in excess of 65 minutes. More than 50% of the visit was devoted to counseling.  Tillman Sers, MD, CDE Adult [facility] Pediatric [facility]inology

## 2021-12-04 ENCOUNTER — Ambulatory Visit (INDEPENDENT_AMBULATORY_CARE_PROVIDER_SITE_OTHER): Payer: Medicare Other | Admitting: "Endocrinology

## 2021-12-04 ENCOUNTER — Other Ambulatory Visit: Payer: Self-pay

## 2021-12-04 ENCOUNTER — Encounter (INDEPENDENT_AMBULATORY_CARE_PROVIDER_SITE_OTHER): Payer: Self-pay | Admitting: "Endocrinology

## 2021-12-04 VITALS — BP 104/62 | HR 76 | Wt 124.4 lb

## 2021-12-04 DIAGNOSIS — M545 Low back pain, unspecified: Secondary | ICD-10-CM | POA: Diagnosis not present

## 2021-12-04 DIAGNOSIS — M25512 Pain in left shoulder: Secondary | ICD-10-CM | POA: Diagnosis not present

## 2021-12-04 DIAGNOSIS — E559 Vitamin D deficiency, unspecified: Secondary | ICD-10-CM | POA: Diagnosis not present

## 2021-12-04 DIAGNOSIS — N2581 Secondary hyperparathyroidism of renal origin: Secondary | ICD-10-CM

## 2021-12-04 DIAGNOSIS — G609 Hereditary and idiopathic neuropathy, unspecified: Secondary | ICD-10-CM

## 2021-12-04 DIAGNOSIS — M8580 Other specified disorders of bone density and structure, unspecified site: Secondary | ICD-10-CM | POA: Diagnosis not present

## 2021-12-04 DIAGNOSIS — E890 Postprocedural hypothyroidism: Secondary | ICD-10-CM

## 2021-12-04 DIAGNOSIS — M546 Pain in thoracic spine: Secondary | ICD-10-CM | POA: Diagnosis not present

## 2021-12-04 NOTE — Patient Instructions (Addendum)
Follow up visit in 6 months. Please repeat lab tests in 3 months and 6 months. Please change Synthroid dosage to 125 mcg/day for 4 days each week and 112 mcg/day for three days each week.  ? ?At [facility], we are committed to providing exceptional care. You will receive a patient satisfaction survey through text or email regarding your visit today. Your opinion is important to me. Comments are appreciated. ? ?

## 2021-12-18 DIAGNOSIS — U071 COVID-19: Secondary | ICD-10-CM | POA: Diagnosis not present

## 2021-12-18 DIAGNOSIS — J029 Acute pharyngitis, unspecified: Secondary | ICD-10-CM | POA: Diagnosis not present

## 2021-12-18 DIAGNOSIS — Z03818 Encounter for observation for suspected exposure to other biological agents ruled out: Secondary | ICD-10-CM | POA: Diagnosis not present

## 2021-12-18 DIAGNOSIS — R509 Fever, unspecified: Secondary | ICD-10-CM | POA: Diagnosis not present

## 2021-12-18 DIAGNOSIS — Z20822 Contact with and (suspected) exposure to covid-19: Secondary | ICD-10-CM | POA: Diagnosis not present

## 2021-12-18 DIAGNOSIS — E038 Other specified hypothyroidism: Secondary | ICD-10-CM | POA: Diagnosis not present

## 2021-12-18 DIAGNOSIS — R5383 Other fatigue: Secondary | ICD-10-CM | POA: Diagnosis not present

## 2021-12-19 DIAGNOSIS — U071 COVID-19: Secondary | ICD-10-CM | POA: Diagnosis not present

## 2021-12-25 DIAGNOSIS — Z03818 Encounter for observation for suspected exposure to other biological agents ruled out: Secondary | ICD-10-CM | POA: Diagnosis not present

## 2021-12-25 DIAGNOSIS — Z20822 Contact with and (suspected) exposure to covid-19: Secondary | ICD-10-CM | POA: Diagnosis not present

## 2022-01-29 ENCOUNTER — Other Ambulatory Visit (INDEPENDENT_AMBULATORY_CARE_PROVIDER_SITE_OTHER): Payer: Self-pay | Admitting: "Endocrinology

## 2022-01-29 DIAGNOSIS — E890 Postprocedural hypothyroidism: Secondary | ICD-10-CM

## 2022-02-14 DIAGNOSIS — H2513 Age-related nuclear cataract, bilateral: Secondary | ICD-10-CM | POA: Diagnosis not present

## 2022-02-14 DIAGNOSIS — H5213 Myopia, bilateral: Secondary | ICD-10-CM | POA: Diagnosis not present

## 2022-02-14 DIAGNOSIS — H35371 Puckering of macula, right eye: Secondary | ICD-10-CM | POA: Diagnosis not present

## 2022-04-03 DIAGNOSIS — N2581 Secondary hyperparathyroidism of renal origin: Secondary | ICD-10-CM | POA: Diagnosis not present

## 2022-04-03 DIAGNOSIS — E890 Postprocedural hypothyroidism: Secondary | ICD-10-CM | POA: Diagnosis not present

## 2022-04-04 LAB — T3, FREE: T3, Free: 3.1 pg/mL (ref 2.3–4.2)

## 2022-04-04 LAB — T4, FREE: Free T4: 1.5 ng/dL (ref 0.8–1.8)

## 2022-04-04 LAB — PTH, INTACT AND CALCIUM
Calcium: 9.4 mg/dL (ref 8.6–10.4)
PTH: 36 pg/mL (ref 16–77)

## 2022-04-04 LAB — TSH: TSH: 0.45 mIU/L (ref 0.40–4.50)

## 2022-04-13 ENCOUNTER — Other Ambulatory Visit (INDEPENDENT_AMBULATORY_CARE_PROVIDER_SITE_OTHER): Payer: Self-pay | Admitting: "Endocrinology

## 2022-04-13 DIAGNOSIS — E890 Postprocedural hypothyroidism: Secondary | ICD-10-CM

## 2022-04-30 ENCOUNTER — Encounter (INDEPENDENT_AMBULATORY_CARE_PROVIDER_SITE_OTHER): Payer: Self-pay | Admitting: "Endocrinology

## 2022-05-01 ENCOUNTER — Other Ambulatory Visit (INDEPENDENT_AMBULATORY_CARE_PROVIDER_SITE_OTHER): Payer: Self-pay

## 2022-05-01 ENCOUNTER — Telehealth (INDEPENDENT_AMBULATORY_CARE_PROVIDER_SITE_OTHER): Payer: Self-pay

## 2022-05-01 DIAGNOSIS — E890 Postprocedural hypothyroidism: Secondary | ICD-10-CM

## 2022-05-01 MED ORDER — LEVOTHYROXINE SODIUM 125 MCG PO TABS: ORAL_TABLET | ORAL | 11 refills | Status: DC

## 2022-05-01 NOTE — Telephone Encounter (Signed)
Spoke with Dr Edwena Blow, let her know per Dr Fransico Michael: The phone disconnected.I will send a my chart message also.

## 2022-05-02 ENCOUNTER — Encounter (INDEPENDENT_AMBULATORY_CARE_PROVIDER_SITE_OTHER): Payer: Self-pay

## 2022-05-09 ENCOUNTER — Telehealth (INDEPENDENT_AMBULATORY_CARE_PROVIDER_SITE_OTHER): Payer: Self-pay | Admitting: "Endocrinology

## 2022-05-09 NOTE — Telephone Encounter (Signed)
  Name of who is calling: Dusty  Caller's Relationship to Patient:  Best contact number: [phone]  Provider they see: Dr. Fransico Michael  Reason for call: [facility] is calling asking to get Biest Topical refilled.      PRESCRIPTION REFILL ONLY  Name of prescription: Biest Topical cream  Pharmacy: [facility]

## 2022-05-13 ENCOUNTER — Telehealth (INDEPENDENT_AMBULATORY_CARE_PROVIDER_SITE_OTHER): Payer: Self-pay | Admitting: "Endocrinology

## 2022-05-13 NOTE — Telephone Encounter (Signed)
Who's calling (name and relationship to patient) : Dusty, [facility]  Best contact number: [phone]  Provider they see: Dr. Fransico Michael   Reason for call: Kristina Rios is calling in stating that they have been trying to contact our office regarding the medication for [facility].  Biest(cream). She has requested a call back.   Call ID:      PRESCRIPTION REFILL ONLY  Name of prescription:  Pharmacy:

## 2022-05-13 NOTE — Telephone Encounter (Signed)
Kristina Rios has called back stating that there is another Rx that needs to be refilled.  Progesterone Sr 50 mg capsule.

## 2022-05-14 NOTE — Telephone Encounter (Signed)
These have been gave to Dr Fransico Michael on a paper script.

## 2022-05-15 ENCOUNTER — Other Ambulatory Visit (INDEPENDENT_AMBULATORY_CARE_PROVIDER_SITE_OTHER): Payer: Self-pay

## 2022-05-15 ENCOUNTER — Encounter (INDEPENDENT_AMBULATORY_CARE_PROVIDER_SITE_OTHER): Payer: Self-pay

## 2022-05-15 DIAGNOSIS — N2581 Secondary hyperparathyroidism of renal origin: Secondary | ICD-10-CM

## 2022-05-15 DIAGNOSIS — E890 Postprocedural hypothyroidism: Secondary | ICD-10-CM

## 2022-05-31 DIAGNOSIS — E890 Postprocedural hypothyroidism: Secondary | ICD-10-CM | POA: Diagnosis not present

## 2022-05-31 DIAGNOSIS — N2581 Secondary hyperparathyroidism of renal origin: Secondary | ICD-10-CM | POA: Diagnosis not present

## 2022-06-01 LAB — T3, FREE: T3, Free: 3.4 pg/mL (ref 2.3–4.2)

## 2022-06-01 LAB — T4, FREE: Free T4: 1.5 ng/dL (ref 0.8–1.8)

## 2022-06-01 LAB — TSH: TSH: 0.66 mIU/L (ref 0.40–4.50)

## 2022-06-06 NOTE — Progress Notes (Unsigned)
Subjective:  Patient Name: Kristina Rios Date of Birth: 07/07/1951  MRN: [id number]  Kristina Rios  presents to the office today for follow-up of her post-surgical hypothyroidism, osteopenia, secondary hyperparathyroidism, vitamin D deficiency, hypocalcemia, arthropathy, and neuropathy of her right leg and foot secondary to a right ACL repair.  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  [name] was unaccompanied.  1. [name] was first referred to me on 02/21/05 by her former primary care provider, Dr. Tommie Ard Baxley, for low thyroid stimulating hormone level. The patient was 71 years old.  A. Lab data on 09/22/03 showed a TSH of 2.241. However, lab data on 10/12/04 showed a TSH of 0.043 and a free T4 of 1.26. Follow up lab tests on 11/3004 showed a TSH of 0.004 and a free T4 of 1.48. The TPO antibody was 297.3, consistent with Hashimoto's disease.  B. When she had her first visit with me, she had had a recent sinus infection and URI and did not feel well. She had some problems with insomnia and early awakening. She woke up hot every morning, but that had not changed in 20 years. She noticed an occasional irregular heart beat. She also noticed herself feeling somewhat jittery and shaky over the past year. She was also feeling anxious at times. Her periods were regular. Past medical history was positive for a diagnosis of osteopenia made 2 years previously. She had Mnire's disease. She also had seasonal allergies. Surgical history was prominent for 2 knee surgeries, tonsils, and adenoids. 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 Her family history was positive for hypothyroidism and osteopenia in her mother. Her father and paternal grandfather had heart disease.  C. On physical examination, her weight was 129.6 pounds at a height of 5 feet 7-1/2 inches. Her BMI  was 19.9. Her blood pressure was 124/80. Her heart rate was 75. She looked like the slender and fit athlete that she was. She was alert and oriented to person place and time. Her affect and insight were fine. She had no acute distress. She had a tender left maxillary sinus. She had a 20+ gram thyroid gland. The thyroid gland was mildly, but diffusely enlarged. Thyroid gland was nontender. She had 1+ tremor of her hands. She had 2+ palmar moisture. Laboratory data showed a TSH of 0.022. Her free T4 was 1.26. Her free T3 was 3.9. Her TSI level was 107 (normal 0-129).  D. The patient had an active left maxillary sinusitis, which I treated with ciprofloxacin.The patient clearly was hyperthyroid. It appeared at that time that the most likely cause for her hyperthyroidism was that she had Hashitoxicosis. In this condition, the patient has flare ups of Hashimoto's disease. As a result of the thyroid inflammation,  preformed thyroid hormone that was in storage leaks out into the blood causing hyperthyroidism. She clearly had an elevated TPO antibody to fit that hypothesis. However, she also had a high-normal TSI level. It was possible that she had 2 different autoimmune processes going on, both Hashimoto's disease and Graves' disease. I decided to follow her clinically.  2. During the next several years, the patient had a very interesting course of autoimmune thyroid disease.   A. During the next year her TFTs fluctuated, but always remained on the hyperthyroid side of normal. By December of 2006 her TSH increased to 0.255, free T4 decreased  to 1.11, and her free T3 decreased to 3.4. At that point it appeared that her Hashimoto's disease was gradually destroying more thyroid cells and that she would likely be euthyroid within the next year.   B. Unfortunately, by 01/29/06 she became significantly more hyperthyroid again. She was chronically tired. Her energy was low. She was not sleeping well. She was having more hot  flashes. Her heart rate had increased to the low 100s. At times she was having dyspnea on exertion. She was having a lot of stomach upset and frequent stools. She was shaking a lot. She was emotionally up and down. She was having more trouble concentrating. She was sweating more. She was also noticing that she was losing proximal muscle strength in that it was now difficult to get up from a squatting position. These were all signs and symptoms of progressive Graves' disease. Her thyroid gland was 25 grams at the time. She had 2+ tremor of her hands. Her TSH was 0.006. Her free T4 was 1.90. Her free T3 was 7.3. At that point her TSI increased to 1.9. This was a 100-fold different reference range than what we had seen previously. According to this reference range any TSI value less than 1.3 was considered normal. At that point she had clear evidence for active Graves' disease. Since she definitely had both Hashimoto's disease and Graves' disease, it made sense to treat her with methimazole, which would control her Graves' disease until such time as Hashimoto's disease had destroyed enough thyroid cells so that she could no longer be thyrotoxic. I started her on methimazole, 20 mg per day.  C. On February 28, 2006, the patient suspected that she was having an adverse reaction to methimazole. In retrospect, she had taken 20 mg of methimazole per day from May 5th to May 24th. On or about May 24th she developed bilateral ankle swelling that was not painful. She stopped the methimazole then. By May 27, however, her right foot was progressively stiff and painful. On May 30 she had stiffness and pain in her left hand. She subsequently developed more stiffness and pain in her right shoulder and arms. She had trouble walking. She had gone on line and found a case report in the [place] [facility] in which a similar case of arthralgias occurred in a patient on methimazole. She saw Doctor Renold Genta, who treated her with  prednisone, giving her some relief.  She then saw Dr. Hurley Cisco, a rheumatologist who diagnosed a probable drug reaction. He continued the prednisone on a tapering regimen.   D. When I saw her next on 04/10/06, the pain and swelling was much diminished, but she still had some right wrist tenderness. Although she looked pretty well that day, I knew that the course of prednisone had likely reduced the conversion of T4 to T3, making her look better than she might be in terms of her lab values. Her TSH was 0.008. Free T4 had decreased to 1.55. Free T3 had decreased to 4.4. TSI was 1.4. We elected to watch her for another month to see how she would do. The patient decided to try some herbal supplements that had been recommended to her to see if they would control her Graves' disease.  E. Unfortunately, at the time of her next visit on 09/09/06 it was clear that she was more hyperthyroid. She was feeling somewhat weaker overall. Her energy was not too bad. Her sleep was not great. She was warm all the  time.  She was still very shaky. Her leg muscles were weaker. Her TSH was 0.008. Her free T4 was 2.38. Free T3 was 10.1. Her TSI was 1.2. We discussed the advantages and disadvantages of definitive therapy with either a thyroidectomy or radioactive iodine treatment. She stated she wanted to think on it.  F.  On  10/21/06, her TSH was 0.005. Her free T4 was 2.97. Her free T3 was up to 10.5. Her TPO at that point was even more elevated at 535.9. At that time she asked me for my recommendation for a surgeon for her. I recommended Dr. Armandina Gemma. She saw Dr. Harlow Asa at his office and they scheduled her surgery. On 02/02/07 she had a thyroidectomy. In late May she was becoming hypothyroid, so I started her on Synthroid 112 mcg per day. Over time, I gradually changed her Synthroid dose.  3. During the past seventeen years, we have also been concerned about her osteopenia and her unintentional weigh loss.   A. At the time  she was recovering from her thyroid surgery we checked her calcium and bone metabolic studies. Her 25-hydroxy vitamin D was 40. Her 1, 25-dihydroxy vitamin D was 82. Her calcium was 9.4. Her PTH was 73.6, which was just slightly above the upper limit of normal of 72. It appeared at that point that she needed a higher calcium intake. Subsequent labs on 07/10/07 showed a 25-hydroxy vitamin D of 47, a 1,25 vitamin D of 82, a PTH of 31.9, and a calcium 10.1. During the last 5 years her PTH values have varied between 29.8 and 80.2. Her calcium values have varied between 9.0 and 10.0. In general, the better her calcium intake, the lower her parathyroid hormone levels have been.  B. In August of 2012, the first visit that I saw her after our health system's conversion to the [facility] electronic medical record system, her weight was 137 pounds. In June 2013 she weighed 132 pounds. In August 2014 she weighed 127 pounds. In February 2015 and again in November 2015 she weighed 124 pounds. In June 2016 her weight had decreased to 116 pounds and decreased further to 114 pounds in September. At her December 2016 visit her weight had increased to 115 pounds.   At that point we had not identified a definite cause of her unintentional weight loss. It was possible, however, that changing to a diet that was both gluten-free and lactose-free had resulted in a net decrease in calories. She has re-gained weight since then.  4. The patient's last [facility] visit was on 12/24/20. I changed her Synthroid regimen to 125 mcg/day for four days each week and 112 mcg/day for three days each week. However, after reviewing her last lab results, I changed her to 125 mcg/day for 2 days each week and 112 mcg/day for 5 days each week. She now takes her Synthroid in the mornings.   A. In the interim, she has been doing "pretty well". Her "digestive problems" have improved. Her allergies have not been acting up recently and her right sinuses are inflamed, but  draining well. She has not had to take antibiotics recently. Her brain fog varies.  B. Her husband, Lawanna Kobus, has severe myositis/PMR. He is now receiving a monthly monoclonal antibody infusion. His atrial flutter is controlled with heart medications. He is able to play 9 holes of golf again.   C. She had more dizziness in January 2023, but not very often since. She then had vestibular testing. She  was told she has a central form of vertigo.  D. She had had more frequent ocular migraines.  She sees an ophthalmologist for this problem. Visual symptoms affect her left visual field, or the right field, or the entire field. . The symptoms lasted about 30-45 minutes. She does not usually have headache or much of a headache. When she has the HA, the HA occurs near the end of the visual symptoms.  E.  Her vitreous detachment is stable. She had a follow up exam in about May 2022. [name] has not had any further detachments.   F. 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    G. [name] continues to take Synthroid, 125 mcg/day for 2 days each week and 112 mcg/day for 5 days each week. She has also been on topical estrogen (E3/E2), twice daily; progesterone SR, 50 mg each morning and 100 mg each evening. She is taking about 1200-1300 mg of combined calcium carbonate and calcium citrate per day. She is also taking 2400 IU of vitamin D per day for 4 days per week and 4800 IU three days per week. She is not taking vitamin K2. She has also been taking 3 different digestive enzymes. She had been fairly stringent with her almost total gluten-free diet. She is no longer dairy-free. She occasionally eats dark chocolate. She still takes her calcium supplements.   H. She is now has [employer] health insurance coverage and a BCBS supplement.  I. She began to note "digestive problems" in late 2022. She complained of belching and  burping. In January she was having more reflux, some nausea, and the feeling of liquid in her throat. Peptobismol helped. She has also had symptoms of abdominal fullness and occasional burning sensations and pains in her lower abdomen. Stools have been normal, cylindrical long pieces. When she feels "blah", she does not want to eat as much. She has seen a naturopath in [place] who is following her for this problem. She is also taking a [place] herbal medication.    5. Pertinent Review of Systems:  Constitutional: The patient feels "good". She is not usually fatigued, but can feel tired aftr an unusually long day. Her energy level is "fairly good". She falls asleep very easily when she is not active. She gets about 6-7 hours of sleep per night. She tends to sleep in later at times. She does not take in much caffeine, none after 3 PM.  Her golf game is going very well.  Face: She has had more drainage from the right maxillary sinus..    Eyes: As above. Vision is fairly good otherwise. Her eyes are still dry, but she has been using a cleansing liquid for her contacts and the dryness is much improved. There are no other significant eye complaints.  Ears: The decreased hearing in her left ear is about the same. She has had Meniere's disease for many year.  Neck: Her neck no longer feels tight in the area of her thyroidectomy, but she still has some difficulty swallowing large pills. The patient has no other complaints of anterior neck swelling, soreness, tenderness,  pressure, discomfort, or difficulty swallowing.  Lungs: No recent problems.   Heart: She has not had many palpitations recently. Heart rate increases with exercise or other physical activity. The patient has no complaints of chest pain or chest pressure. Gastrointestinal: As above. She has very little reflux recently.  Bowel movents seem normal most of the time. The patient has no complaints of excessive hunger, upset stomach, stomach aches or pains,  diarrhea, or constipation. Arms and hands: The discomfort/pains in her right wrist have essentially resolved. The tendinitis in her left elbow recurs occasionally. She now has been having some left wrist pains. She has a little bit of arthritis in her left thumb if she has multi-day golfing events .  Legs: Muscle mass and strength seem normal. Her right lateral calf remains relatively numb since her surgery. There are no other complaints of numbness, tingling, burning, or pain. No edema is noted. Feet: She notes continuing numbness and tingling in both feet, especially in her forefeet on both the plantar and dorsal surfaces. There are no other complaints of tingling or burning. No edema is noted. Neuro: She occasionally has had some shooting pains in her legs and feet. Shin: She occasionally has rashes on her lower legs due to golf course chemicals.   PAST MEDICAL, FAMILY, AND SOCIAL HISTORY:  Past Medical History:  Diagnosis Date   Arthropathy    Complication of anesthesia    Grave's disease    Hearing loss on left    Hyperparathyroidism due to vitamin D deficiency (HCC)    Hypertrophy, nasal, turbinate    Hypocalcemia    Hypothyroidism, acquired, autoimmune    Hypothyroidism, postop    Meniere's disease of left ear    Neuropathy, peripheral    bilateral feet   Neuropathy, peripheral    Osteopenia    Osteopenia    PONV (postoperative nausea and vomiting)    Thyrotoxicosis with diffuse goiter    Vitamin D deficiency disease     Family History  Problem Relation Age of Onset   Thyroid disease Mother    Cancer Brother    Hypertension Brother    Obesity Brother    Heart disease Father    Heart disease Paternal Grandfather    Diabetes Neg Hx    Anemia Neg Hx    Kidney disease Neg Hx      Current Outpatient Medications:    calcium carbonate (OS-CAL - DOSED IN MG OF ELEMENTAL CALCIUM) 1250 MG tablet, Take 1 tablet by mouth., Disp: , Rfl:    cholecalciferol (VITAMIN D3) 25 MCG  (1000 UT) tablet, Take by mouth daily. Taking 2400IU daily, Disp: , Rfl:    Estradiol-Estriol-Progesterone (BIEST/PROGESTERONE) CREA, Take 1 capsule in the morning and 2 capsules at bedtime., Disp: 270 g, Rfl: PRN   levothyroxine (SYNTHROID) 112 MCG tablet, TAKE ONE TABLET 3 DAYS A WEEK, Disp: 36 tablet, Rfl: 3   levothyroxine (SYNTHROID) 125 MCG tablet, Take half pill daily, Disp: 30 tablet, Rfl: 11   magnesium 30 MG tablet, Take 30 mg by mouth 2 (two) times daily., Disp: , Rfl:    MELATONIN ER PO, Take 6 mg by mouth., Disp: , Rfl:    Omega-3 Fatty Acids (OMEGA 3 PO), Take by mouth., Disp: , Rfl:    Progesterone Micronized (PROGESTERONE PO), Take 50 mg by mouth 1 day or 1 dose., Disp: , Rfl:    ALPHA LIPOIC ACID PO, Take by mouth. (Patient not taking: Reported on 05/18/2020), Disp: , Rfl:    IODINE EX, Apply topically. (Patient not taking: Reported on 12/04/2021), Disp: , Rfl:    IODINE-VITAMIN A PO, Take by mouth. (Patient not taking: Reported on 05/18/2020), Disp: , Rfl:    levothyroxine (SYNTHROID) 125 MCG tablet, TAKE 1 TABLET BY MOUTH 5 DAYS PER WEEK (Patient not  taking: Reported on 06/07/2022), Disp: 60 tablet, Rfl: 1   UNABLE TO FIND, Med Name: Iodine 12.5 once a day (Patient not taking: Reported on 12/04/2021), Disp: , Rfl:    zinc gluconate 50 MG tablet, Take 50 mg by mouth daily. (Patient not taking: Reported on 12/04/2021), Disp: , Rfl:   Allergies as of 06/07/2022 - Review Complete 06/07/2022  Allergen Reaction Noted   Amoxicillin-pot clavulanate Nausea And Vomiting 02/21/2011   Methimazole Other (See Comments) 02/21/2011    1. Work and Family: She continues to work as a Solicitor. 2. Activities: Her golf game is going well.    3. Smoking, alcohol, or drugs: She occasionally drinks alcohol. She has never smoked or used drugs. 4. Primary Care Provider: None. She would like to see Dr. Osborne Casco.   REVIEW OF SYSTEMS: There are no other significant problems involving her other  body systems.   Objective:  Vital Signs:  BP 112/68   Pulse 64   Wt 122 lb 3.2 oz (55.4 kg)   BMI 18.96 kg/m    Wt Readings from Last 3 Encounters:  06/07/22 122 lb 3.2 oz (55.4 kg)  12/04/21 124 lb 6.4 oz (56.4 kg)  05/21/21 126 lb (57.2 kg)    Ht Readings from Last 3 Encounters:  11/15/19 5' 7.32" (1.71 m)  10/19/18 5' 7.5" (1.715 m)  05/11/18 5' 7.91" (1.725 m)    HC Readings from Last 3 Encounters:  No data found for [facility]   Body mass index is 18.96 kg/m.  PHYSICAL EXAM:  Constitutional: The patient looks quite good today. She is bright, alert, upbeat, and very personable. Her color is good. Her weight has decreased another 2 pounds. She attributes the lower weight to wearing lighter clothes. It is always a pleasure to see her.  Head: The head is normocephalic. Face: The face appears normal.  Eyes: There is no obvious arcus or proptosis. Moisture appears normal.  Mouth: The oropharynx and tongue appear normal. Oral moisture is normal. Gingiva look normal.  Neck: The neck appears to be visibly normal. No carotid bruits are noted. The thyroid gland is absent. She still has very mild residual induration of her left strap muscle, but much less over time.  Lungs: The lungs are clear to auscultation. Air movement is good. Heart: Heart rate and rhythm are regular. Heart sounds S1 and S2 are normal. I did not appreciate any pathologic cardiac murmurs. Abdomen: The abdomen is normal in size for the patient's age. Bowel sounds are normal. There is no obvious hepatomegaly, splenomegaly, or other mass effect. Her hypogastrium is not sensitive to palpation.  Arms: Muscle size and bulk are normal for age.  Hands: There is no tremor today. Phalangeal and metacarpophalangeal joints are normal. Palmar muscles are normal for age. Palmar skin is normal. Palmar moisture is normal. Legs: Muscles appear normal for age. No edema is present. Neurologic: Strength is normal for age in both the  upper and lower extremities. Muscle tone is normal. Sensation to touch is essentially normal in her left leg, but slightly decreased in her right lateral leg.  Skin: She has minimal red rash on her left leg today. The rash does not itch, so is not excoriated.   LAB DATA:  Labs 05/31/22: TSH 0.66, free T4 1.5, free T3 3.4  Labs 04/03/22: TSH 0.45, free T4 1.5, free T3 3.1; PTH 36 (ref 16-77), calcium 9.4 (ref 8.6-10.4)  Labs 11/21/21: TSH 0.16, free T4 1.7, free T3 3.7; CMP normal;  PTH 50 (ref 16-77), calcium 9.3 (ref 8.6-10.4), 25-OH vitamin D 69  Labs 05/11/21; TSH 3.43, free T4 1.6, free  T3 3.3; CMP normal; PTH 27 (ref 16-77), calcium 9.4, 25-OH vitamin D 70  Labs 02/19/21: TSH 4.65, free T4 1.5, free T3 3.0; CMP normal; CBC ; PTH 21 (ref 16-77), calcium 9.4, 25-OH vitamin D 66  Labs 11/16/20: TSH 5.55, free T4 1.5, free T3 2.9; CMP normal; CBC normal; PTH 42, calcium 9.5, 25-OH vitamin D 71  Labs 05/10/20: TSH 1.16, free T4 1.6, free T3 3.2; PTH 57, calcium 9.7, calcitriol 44 (ref 18-72)  Labs 11/08/19: TSH 0.58, free T4 1.6, free T3 3.5; CBC normal; CMP normal; PTH 39 (ref 14-64), calcium 9.6 (ref 8.6-10.4), 25-OH vitamin D 59; non-fasting lipid panel: cholesterol 207, triglycerides 139, HDL 75, LDL 107  Labs 05/05/19: TSH 0.55, free T4 1.5, free T3 3.1; PTH 40, calcium 9.4, 25-OH vitamin D 74  Labs 11/04/18: TSH 0.43, free T4 1.5, free T3 3.2; PTH 27, calcium 9.0, 25-OH vitamin D 48  Labs 05/04/18: TSH 2.08, free T4 1.4, free T3 2.6; PTH 36, calcium 9.9, 25-OH vitamin D 73  Labs 10/20/17: TSH 0.49, free T4 1.7, free T3 3.4  Labs 04/17/17: TSH 0.71, free T4 1.5, free T3 3.2; PTH 46, calcium 9.4, vitamin B6 196.1 (ref 2.1-21.7), vitamin B12 630 (ref [phone]), folate 10.4 (ref >5.5)  Labs 10/24/16: TSH 0.64, free T4 1.6, free T3 3.1; PTH 32, calcium 9.8; vitamin B6 264.3 (ref 2.1-21.7), vitamin B12 563 (ref [phone]), folate 9.9 (ref >5.4)  Labs 10/18/16: 25-OH vitamin D 57  Labs 08/20/16:  CBC normal, CMP normal; cholesterol 221, triglycerides 57, HDL 87, LDL 120; 25-OH vitamin D 84; HbA1c 5.4%; AM cortisol 21.4, DHEAS 262 (ref 12-133 if not on DHEA); vitamin B12 511 (ref [phone]); vitamin a 63 (ref 38-98), testosterone 36, free testosterone 1.8 (ref 0.1-6.4); zinc 88 (60-130)  Labs 04/09/16: TSH  1.25, free T4 1.6, free T3 2.5; PTH 34, calcium 9.0, 25-OH vitamin D 73  Labs 01/18/16: TSH 0.92, free T4 1.7, free T3 3.0  Labs 08/21/15: TSH 1.406, free T4 1.63, free T3 2.5; PTH 36, calcium 9.0; 25-OH vitamin D 61  Labs 03/21/15: Fructosamine 250 (normal 190-270)  Labs 03/17/15: HbA1c 5.4%; C-peptide 1.97 (0.80-3.90; 25-OH vitamin D 61; calcium 9.0, PTH 46; TSH 1.095, free T4 1.22, free T3 2.8  Labs 08/03/14: TSH 1.595, free T4 1.48, free T3 2.8; calcium 9.5, PTH 37, 25-hydroxy vitamin D 86, 1,25-dihydroxy vitamin D 50  Labs 11/15/13: TSH 2.345, free T4 1.51, free T3 2.8; PTH 52.4, calcium 9.5, 25-hydroxy vitamin D 94; 1,25-dihydroxy vitamin D 92  Labs 05/01/13: TSH 6.151, free T4 1.45, free T3 3.0; CMP normal; cholesterol 211, triglycerides 87, HDL 73, LDL 121  Labs 01/26/13: TSH 2.72, free T4 1.39, free T3 2.5  Labs 09/21/12: TSH 5.308, free T4 0.90, free T3 2.5, PTH 53, calcium 9.2, 25-vitamin D 80, 1,25-vitamin D 64  Labs 02/25/12: TSH 3.443, free T4 1.29, free T3 2.7, calcium 9.5 PTH 46.8, 25-hydroxy vitamin D 87, 1,25-dihydroxy vitamin D 53, WBC 5.8, Hgb 13.0, Hct 38.3  Labs 04/25/11: 25 vitamin D was 82. 1,25 vitamin D was 50. PTH was 80.2.      Component Value Date/Time   WBC 6.7 05/11/2021 1031   HGB 13.2 05/11/2021 1031   HCT 41.0 05/11/2021 1031   PLT 221 05/11/2021 1031   CHOL 207 (H) 11/08/2019 1543   TRIG 139 11/08/2019 1543   HDL  75 11/08/2019 1543   ALT 15 11/21/2021 1500   AST 18 11/21/2021 1500   NA 140 11/21/2021 1500   K 4.1 11/21/2021 1500   CL 103 11/21/2021 1500   CREATININE 0.87 11/21/2021 1500   BUN 20 11/21/2021 1500   CO2 30 11/21/2021 1500    TSH 0.66 05/31/2022 1121   FREET4 1.5 05/31/2022 1121   T3FREE 3.4 05/31/2022 1121   HGBA1C 5.4 03/17/2015 1417   MICROALBUR 1.08 05/01/2013 0958   CALCIUM 9.4 04/03/2022 1439   CALCIUM 9.2 08/10/2012 1049   PTH 36 04/03/2022 1439   IMAGING:  06/09/15: DEXA scan: Lumbar spine had a T-score of -2.6. There had been statistically significant interval decreased in bone mineral density at the following sites compared to exam of 09/17/2012: Left femur -4.4% and right femur -6.4%   Assessment and Plan:   ASSESSMENT:  1. Postsurgical hypothyroidism:   A. Since her thyroidectomy, we have made several changes in her Synthroid dosage over time.   B. At her prior visit her TSH was too low and she was still having some difficulties with early awakening, I suspected that her liver was not degrading thyroid hormones quite as well as it used to do. She needed a small reduction in her Synthroid dose, with the goal for her to have a TSH in the 1.0-2.0 range.  C. At her August 2021 visit her TFTs were in the middle of the true physiologic range, with her TSH in the goal range of 1.0-2.0.   D. In February 2022 her TSH was much higher, so I increased her Synthroid dosage. In May 2022 her TSH was lower, but still too high, so I increased her dosage again. In August 2022 her TSH was lower, but still above the goal range of 1.0-2.0. I increased the dose again. Her TFTs were hyperthyroid in February 2023, so I reduced her dosage.  E. In September 2023 she is clinically and chemically euthyroid. However, if her liver slows down in metabolizing her thyroid hormones, she may need further reductions in her dosage.  2-4. Secondary hyperparathyroidism/hypocalcemia/vitamin D deficiency:  A. Since her thyroidectomy her calcium and PTH levels have also fluctuated over time. When she took in less calcium, her PTH levels increased. The converse has been true.   B. Her PTH, calcium, and vitamin D were all about mid-normal in  February 2021 and again in August 2021.  C. Her vitamin D, PTH,  and calcium were good in February 2022. In August 2022 her PTH and vitamin D were good, but her calcium level was at about the 44%. In February 2023 her PTH was higher, but still normal. Her calcium was lower, but still normal. Her vitamin D was good. I suspect that her current calcium brand is not as well absorbed as what she took previously.  5. Osteopenia/Osteoporosis: Patient's last bone mineral density was at [facility] on 05/22/15: Her lumbar spine T score was -2.6, c/w osteoporosis. There were statistically significant decreases in bone density of 4.4% in the left femur and 6.4% in the right femur. The recommendation from Dr. Christene Slates, MD at [facility] was to maintain adequate dietary intake of calcium and vitamin D and to continue weight-bearing exercise as tolerated. As noted above, her current intake of vitamin D is good and her intake/absorption of calcium is good. She will continue her excellent amount of weight-bearing exercise. We will discuss other options for treatment as appropriate.  6. Unintentional weight loss: She attributes her  recent weight loss to wearing clothing that weighs less. She has also been active in the heat many times this Summer.   7. Visual field defect, peripheral and bilateral: As above.   8. Peripheral neuropathy, hereditary/idiopathic: She thinks that she first noticed this problem prior to her thyroid surgery. Her B12, B6, and folate levels have been very normal several times. 9. Vertigo: She does have underlying Meniere's disease. Fortunately, she has not had much vertigo this season.     PLAN:  1. Diagnostic: Reviewed her recent lab results.  2. Therapeutic: Decrease her Synthroid regimen to one 125 mcg tablet/day for 2 day per week and take 112 mcg/day on five days per week. Since her insurance will only cover one strength of thyroid hormone, we will convert her to 112 mcg tablets, taking  one tablet per day on 6 days each week and taking two tablets on 1 day each week. Maintain her vitamin D intake, but try to increase the calcium intake by 20%.  3. Patient education: We discussed all of the above at great length. Continue to keep up her usual exercise regimen. 4. Follow-up: Try to refer her to Dr. Osborne Casco.   Level of Service: This visit lasted in excess of 75 minutes. More than 50% of the visit was devoted to counseling.  Tillman Sers, MD, CDE [facility]inology

## 2022-06-07 ENCOUNTER — Ambulatory Visit (INDEPENDENT_AMBULATORY_CARE_PROVIDER_SITE_OTHER): Payer: Medicare Other | Admitting: "Endocrinology

## 2022-06-07 ENCOUNTER — Encounter (INDEPENDENT_AMBULATORY_CARE_PROVIDER_SITE_OTHER): Payer: Self-pay | Admitting: "Endocrinology

## 2022-06-07 VITALS — BP 112/68 | HR 64 | Wt 122.2 lb

## 2022-06-07 DIAGNOSIS — G609 Hereditary and idiopathic neuropathy, unspecified: Secondary | ICD-10-CM

## 2022-06-07 DIAGNOSIS — R634 Abnormal weight loss: Secondary | ICD-10-CM | POA: Diagnosis not present

## 2022-06-07 DIAGNOSIS — H8112 Benign paroxysmal vertigo, left ear: Secondary | ICD-10-CM

## 2022-06-07 DIAGNOSIS — E890 Postprocedural hypothyroidism: Secondary | ICD-10-CM

## 2022-06-07 DIAGNOSIS — M818 Other osteoporosis without current pathological fracture: Secondary | ICD-10-CM | POA: Diagnosis not present

## 2022-06-07 DIAGNOSIS — E208 Other hypoparathyroidism: Secondary | ICD-10-CM

## 2022-06-07 MED ORDER — SYNTHROID 112 MCG PO TABS: ORAL_TABLET | ORAL | 3 refills | Status: AC

## 2022-06-07 NOTE — Patient Instructions (Signed)
No further follow up here.  At [facility], we are committed to providing exceptional care. You will receive a patient satisfaction survey through text or email regarding your visit today. Your opinion is important to me. Comments are appreciated.  

## 2022-07-15 ENCOUNTER — Encounter (INDEPENDENT_AMBULATORY_CARE_PROVIDER_SITE_OTHER): Payer: Self-pay | Admitting: "Endocrinology

## 2022-07-15 ENCOUNTER — Encounter (INDEPENDENT_AMBULATORY_CARE_PROVIDER_SITE_OTHER): Payer: Self-pay

## 2022-07-18 DIAGNOSIS — M545 Low back pain, unspecified: Secondary | ICD-10-CM | POA: Diagnosis not present

## 2022-07-18 DIAGNOSIS — M546 Pain in thoracic spine: Secondary | ICD-10-CM | POA: Diagnosis not present

## 2022-07-18 DIAGNOSIS — M25512 Pain in left shoulder: Secondary | ICD-10-CM | POA: Diagnosis not present

## 2022-07-19 DIAGNOSIS — E559 Vitamin D deficiency, unspecified: Secondary | ICD-10-CM | POA: Diagnosis not present

## 2022-07-19 DIAGNOSIS — G629 Polyneuropathy, unspecified: Secondary | ICD-10-CM | POA: Diagnosis not present

## 2022-07-19 DIAGNOSIS — N2581 Secondary hyperparathyroidism of renal origin: Secondary | ICD-10-CM | POA: Diagnosis not present

## 2022-07-19 DIAGNOSIS — E890 Postprocedural hypothyroidism: Secondary | ICD-10-CM | POA: Diagnosis not present

## 2022-07-19 DIAGNOSIS — R634 Abnormal weight loss: Secondary | ICD-10-CM | POA: Diagnosis not present

## 2022-07-19 DIAGNOSIS — M810 Age-related osteoporosis without current pathological fracture: Secondary | ICD-10-CM | POA: Diagnosis not present

## 2022-07-19 DIAGNOSIS — H8102 Meniere's disease, left ear: Secondary | ICD-10-CM | POA: Diagnosis not present

## 2022-07-30 DIAGNOSIS — M546 Pain in thoracic spine: Secondary | ICD-10-CM | POA: Diagnosis not present

## 2022-07-30 DIAGNOSIS — M545 Low back pain, unspecified: Secondary | ICD-10-CM | POA: Diagnosis not present

## 2022-07-30 DIAGNOSIS — M25512 Pain in left shoulder: Secondary | ICD-10-CM | POA: Diagnosis not present

## 2022-08-13 DIAGNOSIS — M545 Low back pain, unspecified: Secondary | ICD-10-CM | POA: Diagnosis not present

## 2022-08-13 DIAGNOSIS — M25512 Pain in left shoulder: Secondary | ICD-10-CM | POA: Diagnosis not present

## 2022-08-13 DIAGNOSIS — M546 Pain in thoracic spine: Secondary | ICD-10-CM | POA: Diagnosis not present

## 2022-08-27 DIAGNOSIS — M25512 Pain in left shoulder: Secondary | ICD-10-CM | POA: Diagnosis not present

## 2022-08-27 DIAGNOSIS — M546 Pain in thoracic spine: Secondary | ICD-10-CM | POA: Diagnosis not present

## 2022-08-27 DIAGNOSIS — M545 Low back pain, unspecified: Secondary | ICD-10-CM | POA: Diagnosis not present

## 2022-09-12 DIAGNOSIS — M545 Low back pain, unspecified: Secondary | ICD-10-CM | POA: Diagnosis not present

## 2022-09-12 DIAGNOSIS — M546 Pain in thoracic spine: Secondary | ICD-10-CM | POA: Diagnosis not present

## 2022-09-12 DIAGNOSIS — M25512 Pain in left shoulder: Secondary | ICD-10-CM | POA: Diagnosis not present

## 2022-09-16 DIAGNOSIS — M546 Pain in thoracic spine: Secondary | ICD-10-CM | POA: Diagnosis not present

## 2022-09-16 DIAGNOSIS — M545 Low back pain, unspecified: Secondary | ICD-10-CM | POA: Diagnosis not present

## 2022-09-16 DIAGNOSIS — M25512 Pain in left shoulder: Secondary | ICD-10-CM | POA: Diagnosis not present

## 2022-09-25 DIAGNOSIS — M546 Pain in thoracic spine: Secondary | ICD-10-CM | POA: Diagnosis not present

## 2022-09-25 DIAGNOSIS — M25512 Pain in left shoulder: Secondary | ICD-10-CM | POA: Diagnosis not present

## 2022-09-25 DIAGNOSIS — M545 Low back pain, unspecified: Secondary | ICD-10-CM | POA: Diagnosis not present

## 2022-10-03 DIAGNOSIS — M25512 Pain in left shoulder: Secondary | ICD-10-CM | POA: Diagnosis not present

## 2022-10-03 DIAGNOSIS — M546 Pain in thoracic spine: Secondary | ICD-10-CM | POA: Diagnosis not present

## 2022-10-03 DIAGNOSIS — M545 Low back pain, unspecified: Secondary | ICD-10-CM | POA: Diagnosis not present

## 2022-10-11 DIAGNOSIS — M546 Pain in thoracic spine: Secondary | ICD-10-CM | POA: Diagnosis not present

## 2022-10-11 DIAGNOSIS — M25512 Pain in left shoulder: Secondary | ICD-10-CM | POA: Diagnosis not present

## 2022-10-11 DIAGNOSIS — M545 Low back pain, unspecified: Secondary | ICD-10-CM | POA: Diagnosis not present

## 2022-10-25 DIAGNOSIS — M25512 Pain in left shoulder: Secondary | ICD-10-CM | POA: Diagnosis not present

## 2022-10-25 DIAGNOSIS — M545 Low back pain, unspecified: Secondary | ICD-10-CM | POA: Diagnosis not present

## 2022-10-25 DIAGNOSIS — M546 Pain in thoracic spine: Secondary | ICD-10-CM | POA: Diagnosis not present

## 2022-11-01 DIAGNOSIS — M25522 Pain in left elbow: Secondary | ICD-10-CM | POA: Diagnosis not present

## 2022-11-07 DIAGNOSIS — M25521 Pain in right elbow: Secondary | ICD-10-CM | POA: Diagnosis not present

## 2022-11-07 DIAGNOSIS — M25522 Pain in left elbow: Secondary | ICD-10-CM | POA: Diagnosis not present

## 2022-11-12 DIAGNOSIS — M25512 Pain in left shoulder: Secondary | ICD-10-CM | POA: Diagnosis not present

## 2022-11-12 DIAGNOSIS — M545 Low back pain, unspecified: Secondary | ICD-10-CM | POA: Diagnosis not present

## 2022-11-12 DIAGNOSIS — M546 Pain in thoracic spine: Secondary | ICD-10-CM | POA: Diagnosis not present

## 2022-11-14 DIAGNOSIS — M25521 Pain in right elbow: Secondary | ICD-10-CM | POA: Diagnosis not present

## 2022-11-14 DIAGNOSIS — M25522 Pain in left elbow: Secondary | ICD-10-CM | POA: Diagnosis not present

## 2022-11-21 DIAGNOSIS — M25522 Pain in left elbow: Secondary | ICD-10-CM | POA: Diagnosis not present

## 2022-11-21 DIAGNOSIS — M25521 Pain in right elbow: Secondary | ICD-10-CM | POA: Diagnosis not present

## 2022-11-25 DIAGNOSIS — N2581 Secondary hyperparathyroidism of renal origin: Secondary | ICD-10-CM | POA: Diagnosis not present

## 2022-11-25 DIAGNOSIS — M25521 Pain in right elbow: Secondary | ICD-10-CM | POA: Diagnosis not present

## 2022-11-25 DIAGNOSIS — R7989 Other specified abnormal findings of blood chemistry: Secondary | ICD-10-CM | POA: Diagnosis not present

## 2022-11-25 DIAGNOSIS — E890 Postprocedural hypothyroidism: Secondary | ICD-10-CM | POA: Diagnosis not present

## 2022-11-25 DIAGNOSIS — E559 Vitamin D deficiency, unspecified: Secondary | ICD-10-CM | POA: Diagnosis not present

## 2022-11-25 DIAGNOSIS — M25522 Pain in left elbow: Secondary | ICD-10-CM | POA: Diagnosis not present

## 2022-12-02 DIAGNOSIS — M25621 Stiffness of right elbow, not elsewhere classified: Secondary | ICD-10-CM | POA: Diagnosis not present

## 2022-12-03 DIAGNOSIS — Z1331 Encounter for screening for depression: Secondary | ICD-10-CM | POA: Diagnosis not present

## 2022-12-03 DIAGNOSIS — E559 Vitamin D deficiency, unspecified: Secondary | ICD-10-CM | POA: Diagnosis not present

## 2022-12-03 DIAGNOSIS — Z1212 Encounter for screening for malignant neoplasm of rectum: Secondary | ICD-10-CM | POA: Diagnosis not present

## 2022-12-03 DIAGNOSIS — H8102 Meniere's disease, left ear: Secondary | ICD-10-CM | POA: Diagnosis not present

## 2022-12-03 DIAGNOSIS — Z0000 Encounter for general adult medical examination without abnormal findings: Secondary | ICD-10-CM | POA: Diagnosis not present

## 2022-12-03 DIAGNOSIS — R82998 Other abnormal findings in urine: Secondary | ICD-10-CM | POA: Diagnosis not present

## 2022-12-03 DIAGNOSIS — N2581 Secondary hyperparathyroidism of renal origin: Secondary | ICD-10-CM | POA: Diagnosis not present

## 2022-12-03 DIAGNOSIS — G629 Polyneuropathy, unspecified: Secondary | ICD-10-CM | POA: Diagnosis not present

## 2022-12-03 DIAGNOSIS — Z1339 Encounter for screening examination for other mental health and behavioral disorders: Secondary | ICD-10-CM | POA: Diagnosis not present

## 2022-12-03 DIAGNOSIS — M791 Myalgia, unspecified site: Secondary | ICD-10-CM | POA: Diagnosis not present

## 2022-12-03 DIAGNOSIS — E890 Postprocedural hypothyroidism: Secondary | ICD-10-CM | POA: Diagnosis not present

## 2022-12-03 DIAGNOSIS — M25522 Pain in left elbow: Secondary | ICD-10-CM | POA: Diagnosis not present

## 2022-12-03 DIAGNOSIS — M810 Age-related osteoporosis without current pathological fracture: Secondary | ICD-10-CM | POA: Diagnosis not present

## 2022-12-03 DIAGNOSIS — R634 Abnormal weight loss: Secondary | ICD-10-CM | POA: Diagnosis not present

## 2022-12-04 ENCOUNTER — Other Ambulatory Visit: Payer: Self-pay | Admitting: Internal Medicine

## 2022-12-04 DIAGNOSIS — Z0000 Encounter for general adult medical examination without abnormal findings: Secondary | ICD-10-CM

## 2022-12-06 DIAGNOSIS — M25522 Pain in left elbow: Secondary | ICD-10-CM | POA: Diagnosis not present

## 2022-12-10 DIAGNOSIS — M545 Low back pain, unspecified: Secondary | ICD-10-CM | POA: Diagnosis not present

## 2022-12-10 DIAGNOSIS — M546 Pain in thoracic spine: Secondary | ICD-10-CM | POA: Diagnosis not present

## 2022-12-10 DIAGNOSIS — M25512 Pain in left shoulder: Secondary | ICD-10-CM | POA: Diagnosis not present

## 2022-12-19 DIAGNOSIS — M25521 Pain in right elbow: Secondary | ICD-10-CM | POA: Diagnosis not present

## 2022-12-23 ENCOUNTER — Ambulatory Visit
Admission: RE | Admit: 2022-12-23 | Discharge: 2022-12-23 | Disposition: A | Discharge status: Home / Self Care | Payer: Medicare Other | Source: Ambulatory Visit | Attending: Internal Medicine | Admitting: Internal Medicine

## 2022-12-23 DIAGNOSIS — Z0000 Encounter for general adult medical examination without abnormal findings: Secondary | ICD-10-CM

## 2022-12-23 DIAGNOSIS — Z1231 Encounter for screening mammogram for malignant neoplasm of breast: Secondary | ICD-10-CM | POA: Diagnosis not present

## 2023-01-02 DIAGNOSIS — M25521 Pain in right elbow: Secondary | ICD-10-CM | POA: Diagnosis not present

## 2023-01-02 DIAGNOSIS — M25522 Pain in left elbow: Secondary | ICD-10-CM | POA: Diagnosis not present

## 2023-01-07 DIAGNOSIS — E890 Postprocedural hypothyroidism: Secondary | ICD-10-CM | POA: Diagnosis not present

## 2023-01-13 DIAGNOSIS — M545 Low back pain, unspecified: Secondary | ICD-10-CM | POA: Diagnosis not present

## 2023-01-13 DIAGNOSIS — M546 Pain in thoracic spine: Secondary | ICD-10-CM | POA: Diagnosis not present

## 2023-01-13 DIAGNOSIS — M25512 Pain in left shoulder: Secondary | ICD-10-CM | POA: Diagnosis not present

## 2023-01-20 DIAGNOSIS — M25521 Pain in right elbow: Secondary | ICD-10-CM | POA: Diagnosis not present

## 2023-01-20 DIAGNOSIS — M25522 Pain in left elbow: Secondary | ICD-10-CM | POA: Diagnosis not present

## 2023-02-12 DIAGNOSIS — M25521 Pain in right elbow: Secondary | ICD-10-CM | POA: Diagnosis not present

## 2023-02-17 DIAGNOSIS — H5213 Myopia, bilateral: Secondary | ICD-10-CM | POA: Diagnosis not present

## 2023-02-17 DIAGNOSIS — H35371 Puckering of macula, right eye: Secondary | ICD-10-CM | POA: Diagnosis not present

## 2023-02-26 DIAGNOSIS — R634 Abnormal weight loss: Secondary | ICD-10-CM | POA: Diagnosis not present

## 2023-02-26 DIAGNOSIS — R140 Abdominal distension (gaseous): Secondary | ICD-10-CM | POA: Diagnosis not present

## 2023-02-26 DIAGNOSIS — E890 Postprocedural hypothyroidism: Secondary | ICD-10-CM | POA: Diagnosis not present

## 2023-03-06 DIAGNOSIS — M25521 Pain in right elbow: Secondary | ICD-10-CM | POA: Diagnosis not present

## 2023-06-09 DIAGNOSIS — N2581 Secondary hyperparathyroidism of renal origin: Secondary | ICD-10-CM | POA: Diagnosis not present

## 2023-06-09 DIAGNOSIS — E559 Vitamin D deficiency, unspecified: Secondary | ICD-10-CM | POA: Diagnosis not present

## 2023-06-09 DIAGNOSIS — E890 Postprocedural hypothyroidism: Secondary | ICD-10-CM | POA: Diagnosis not present

## 2023-06-09 DIAGNOSIS — H8102 Meniere's disease, left ear: Secondary | ICD-10-CM | POA: Diagnosis not present

## 2023-06-09 DIAGNOSIS — M810 Age-related osteoporosis without current pathological fracture: Secondary | ICD-10-CM | POA: Diagnosis not present

## 2023-06-09 DIAGNOSIS — R634 Abnormal weight loss: Secondary | ICD-10-CM | POA: Diagnosis not present

## 2023-06-09 DIAGNOSIS — G629 Polyneuropathy, unspecified: Secondary | ICD-10-CM | POA: Diagnosis not present

## 2023-07-31 ENCOUNTER — Other Ambulatory Visit: Payer: Self-pay | Admitting: Obstetrics and Gynecology

## 2023-07-31 DIAGNOSIS — Z124 Encounter for screening for malignant neoplasm of cervix: Secondary | ICD-10-CM | POA: Diagnosis not present

## 2023-07-31 DIAGNOSIS — E2839 Other primary ovarian failure: Secondary | ICD-10-CM

## 2023-07-31 DIAGNOSIS — Z01419 Encounter for gynecological examination (general) (routine) without abnormal findings: Secondary | ICD-10-CM | POA: Diagnosis not present

## 2023-08-19 DIAGNOSIS — M25512 Pain in left shoulder: Secondary | ICD-10-CM | POA: Diagnosis not present

## 2023-08-19 DIAGNOSIS — M545 Low back pain, unspecified: Secondary | ICD-10-CM | POA: Diagnosis not present

## 2023-08-19 DIAGNOSIS — M546 Pain in thoracic spine: Secondary | ICD-10-CM | POA: Diagnosis not present

## 2023-09-03 DIAGNOSIS — M545 Low back pain, unspecified: Secondary | ICD-10-CM | POA: Diagnosis not present

## 2023-09-03 DIAGNOSIS — M25512 Pain in left shoulder: Secondary | ICD-10-CM | POA: Diagnosis not present

## 2023-09-03 DIAGNOSIS — M546 Pain in thoracic spine: Secondary | ICD-10-CM | POA: Diagnosis not present

## 2023-09-19 DIAGNOSIS — M545 Low back pain, unspecified: Secondary | ICD-10-CM | POA: Diagnosis not present

## 2023-09-19 DIAGNOSIS — M25512 Pain in left shoulder: Secondary | ICD-10-CM | POA: Diagnosis not present

## 2023-09-19 DIAGNOSIS — M546 Pain in thoracic spine: Secondary | ICD-10-CM | POA: Diagnosis not present

## 2023-10-04 DIAGNOSIS — M25512 Pain in left shoulder: Secondary | ICD-10-CM | POA: Diagnosis not present

## 2023-10-04 DIAGNOSIS — M546 Pain in thoracic spine: Secondary | ICD-10-CM | POA: Diagnosis not present

## 2023-10-04 DIAGNOSIS — M545 Low back pain, unspecified: Secondary | ICD-10-CM | POA: Diagnosis not present

## 2023-10-14 DIAGNOSIS — Z1211 Encounter for screening for malignant neoplasm of colon: Secondary | ICD-10-CM | POA: Diagnosis not present

## 2023-10-15 DIAGNOSIS — M545 Low back pain, unspecified: Secondary | ICD-10-CM | POA: Diagnosis not present

## 2023-10-15 DIAGNOSIS — M25512 Pain in left shoulder: Secondary | ICD-10-CM | POA: Diagnosis not present

## 2023-10-15 DIAGNOSIS — M546 Pain in thoracic spine: Secondary | ICD-10-CM | POA: Diagnosis not present

## 2023-10-22 LAB — EXTERNAL GENERIC LAB PROCEDURE: COLOGUARD: NEGATIVE

## 2023-10-22 LAB — COLOGUARD: COLOGUARD: NEGATIVE

## 2023-10-31 DIAGNOSIS — M545 Low back pain, unspecified: Secondary | ICD-10-CM | POA: Diagnosis not present

## 2023-10-31 DIAGNOSIS — M546 Pain in thoracic spine: Secondary | ICD-10-CM | POA: Diagnosis not present

## 2023-10-31 DIAGNOSIS — M25512 Pain in left shoulder: Secondary | ICD-10-CM | POA: Diagnosis not present

## 2023-11-17 DIAGNOSIS — M546 Pain in thoracic spine: Secondary | ICD-10-CM | POA: Diagnosis not present

## 2023-11-17 DIAGNOSIS — M25512 Pain in left shoulder: Secondary | ICD-10-CM | POA: Diagnosis not present

## 2023-11-17 DIAGNOSIS — M545 Low back pain, unspecified: Secondary | ICD-10-CM | POA: Diagnosis not present

## 2023-12-01 DIAGNOSIS — M546 Pain in thoracic spine: Secondary | ICD-10-CM | POA: Diagnosis not present

## 2023-12-01 DIAGNOSIS — M545 Low back pain, unspecified: Secondary | ICD-10-CM | POA: Diagnosis not present

## 2023-12-01 DIAGNOSIS — M25512 Pain in left shoulder: Secondary | ICD-10-CM | POA: Diagnosis not present

## 2023-12-08 DIAGNOSIS — Z1212 Encounter for screening for malignant neoplasm of rectum: Secondary | ICD-10-CM | POA: Diagnosis not present

## 2023-12-08 DIAGNOSIS — E890 Postprocedural hypothyroidism: Secondary | ICD-10-CM | POA: Diagnosis not present

## 2023-12-08 DIAGNOSIS — M810 Age-related osteoporosis without current pathological fracture: Secondary | ICD-10-CM | POA: Diagnosis not present

## 2023-12-08 DIAGNOSIS — R7989 Other specified abnormal findings of blood chemistry: Secondary | ICD-10-CM | POA: Diagnosis not present

## 2023-12-08 DIAGNOSIS — E559 Vitamin D deficiency, unspecified: Secondary | ICD-10-CM | POA: Diagnosis not present

## 2023-12-15 DIAGNOSIS — E559 Vitamin D deficiency, unspecified: Secondary | ICD-10-CM | POA: Diagnosis not present

## 2023-12-15 DIAGNOSIS — M810 Age-related osteoporosis without current pathological fracture: Secondary | ICD-10-CM | POA: Diagnosis not present

## 2023-12-15 DIAGNOSIS — H8102 Meniere's disease, left ear: Secondary | ICD-10-CM | POA: Diagnosis not present

## 2023-12-15 DIAGNOSIS — Z79890 Hormone replacement therapy (postmenopausal): Secondary | ICD-10-CM | POA: Diagnosis not present

## 2023-12-15 DIAGNOSIS — E890 Postprocedural hypothyroidism: Secondary | ICD-10-CM | POA: Diagnosis not present

## 2023-12-15 DIAGNOSIS — R82998 Other abnormal findings in urine: Secondary | ICD-10-CM | POA: Diagnosis not present

## 2023-12-15 DIAGNOSIS — G629 Polyneuropathy, unspecified: Secondary | ICD-10-CM | POA: Diagnosis not present

## 2023-12-15 DIAGNOSIS — Z0000 Encounter for general adult medical examination without abnormal findings: Secondary | ICD-10-CM | POA: Diagnosis not present

## 2023-12-15 DIAGNOSIS — N2581 Secondary hyperparathyroidism of renal origin: Secondary | ICD-10-CM | POA: Diagnosis not present

## 2023-12-15 DIAGNOSIS — Z1339 Encounter for screening examination for other mental health and behavioral disorders: Secondary | ICD-10-CM | POA: Diagnosis not present

## 2023-12-15 DIAGNOSIS — R634 Abnormal weight loss: Secondary | ICD-10-CM | POA: Diagnosis not present

## 2023-12-15 DIAGNOSIS — Z1331 Encounter for screening for depression: Secondary | ICD-10-CM | POA: Diagnosis not present

## 2023-12-24 DIAGNOSIS — M546 Pain in thoracic spine: Secondary | ICD-10-CM | POA: Diagnosis not present

## 2023-12-24 DIAGNOSIS — M25512 Pain in left shoulder: Secondary | ICD-10-CM | POA: Diagnosis not present

## 2023-12-24 DIAGNOSIS — M545 Low back pain, unspecified: Secondary | ICD-10-CM | POA: Diagnosis not present

## 2024-03-23 ENCOUNTER — Other Ambulatory Visit: Payer: Medicare Other

## 2024-04-05 ENCOUNTER — Ambulatory Visit (HOSPITAL_BASED_OUTPATIENT_CLINIC_OR_DEPARTMENT_OTHER)
Admission: RE | Admit: 2024-04-05 | Discharge: 2024-04-05 | Disposition: A | Discharge status: Home / Self Care | Source: Ambulatory Visit | Attending: Obstetrics and Gynecology | Admitting: Obstetrics and Gynecology

## 2024-04-05 DIAGNOSIS — M810 Age-related osteoporosis without current pathological fracture: Secondary | ICD-10-CM | POA: Diagnosis not present

## 2024-04-05 DIAGNOSIS — E2839 Other primary ovarian failure: Secondary | ICD-10-CM | POA: Insufficient documentation

## 2024-04-05 DIAGNOSIS — Z780 Asymptomatic menopausal state: Secondary | ICD-10-CM | POA: Diagnosis not present

## 2024-05-17 DIAGNOSIS — D3132 Benign neoplasm of left choroid: Secondary | ICD-10-CM | POA: Diagnosis not present

## 2024-05-17 DIAGNOSIS — H2513 Age-related nuclear cataract, bilateral: Secondary | ICD-10-CM | POA: Diagnosis not present

## 2024-05-17 DIAGNOSIS — H35371 Puckering of macula, right eye: Secondary | ICD-10-CM | POA: Diagnosis not present

## 2024-05-17 DIAGNOSIS — H524 Presbyopia: Secondary | ICD-10-CM | POA: Diagnosis not present

## 2024-06-09 DIAGNOSIS — E890 Postprocedural hypothyroidism: Secondary | ICD-10-CM | POA: Diagnosis not present

## 2024-06-09 DIAGNOSIS — H8102 Meniere's disease, left ear: Secondary | ICD-10-CM | POA: Diagnosis not present

## 2024-06-09 DIAGNOSIS — N2581 Secondary hyperparathyroidism of renal origin: Secondary | ICD-10-CM | POA: Diagnosis not present

## 2024-06-09 DIAGNOSIS — R634 Abnormal weight loss: Secondary | ICD-10-CM | POA: Diagnosis not present

## 2024-06-09 DIAGNOSIS — G629 Polyneuropathy, unspecified: Secondary | ICD-10-CM | POA: Diagnosis not present

## 2024-06-09 DIAGNOSIS — E039 Hypothyroidism, unspecified: Secondary | ICD-10-CM | POA: Diagnosis not present

## 2024-06-09 DIAGNOSIS — E559 Vitamin D deficiency, unspecified: Secondary | ICD-10-CM | POA: Diagnosis not present

## 2024-06-09 DIAGNOSIS — M810 Age-related osteoporosis without current pathological fracture: Secondary | ICD-10-CM | POA: Diagnosis not present

## 2024-06-09 DIAGNOSIS — Z79890 Hormone replacement therapy (postmenopausal): Secondary | ICD-10-CM | POA: Diagnosis not present

## 2024-06-30 DIAGNOSIS — M810 Age-related osteoporosis without current pathological fracture: Secondary | ICD-10-CM | POA: Diagnosis not present

## 2024-06-30 DIAGNOSIS — E890 Postprocedural hypothyroidism: Secondary | ICD-10-CM | POA: Diagnosis not present

## 2024-06-30 DIAGNOSIS — E559 Vitamin D deficiency, unspecified: Secondary | ICD-10-CM | POA: Diagnosis not present

## 2024-07-19 DIAGNOSIS — M810 Age-related osteoporosis without current pathological fracture: Secondary | ICD-10-CM | POA: Diagnosis not present

## 2024-07-29 DIAGNOSIS — M546 Pain in thoracic spine: Secondary | ICD-10-CM | POA: Diagnosis not present

## 2024-07-29 DIAGNOSIS — M25512 Pain in left shoulder: Secondary | ICD-10-CM | POA: Diagnosis not present

## 2024-07-29 DIAGNOSIS — M545 Low back pain, unspecified: Secondary | ICD-10-CM | POA: Diagnosis not present

## 2024-08-23 DIAGNOSIS — M810 Age-related osteoporosis without current pathological fracture: Secondary | ICD-10-CM | POA: Diagnosis not present

## 2024-08-23 DIAGNOSIS — E890 Postprocedural hypothyroidism: Secondary | ICD-10-CM | POA: Diagnosis not present
